# Patient Record
Sex: Female | Born: 1955 | Race: White | Hispanic: No | Marital: Married | Smoking: Never smoker
Health system: Southern US, Community
[De-identification: ages and names within clinical notes are randomized; demographics above are authoritative.]

## PROBLEM LIST (undated history)

## (undated) DIAGNOSIS — I700 Atherosclerosis of aorta: Secondary | ICD-10-CM

## (undated) HISTORY — DX: Atherosclerosis of aorta: I70.0

---

## 1998-09-19 ENCOUNTER — Other Ambulatory Visit: Admission: RE | Admit: 1998-09-19 | Discharge: 1998-09-19 | Payer: Self-pay | Admitting: Obstetrics and Gynecology

## 1999-06-10 ENCOUNTER — Ambulatory Visit (HOSPITAL_COMMUNITY): Admission: RE | Admit: 1999-06-10 | Discharge: 1999-06-10 | Payer: Self-pay | Admitting: *Deleted

## 1999-10-07 ENCOUNTER — Other Ambulatory Visit: Admission: RE | Admit: 1999-10-07 | Discharge: 1999-10-07 | Payer: Self-pay | Admitting: Obstetrics and Gynecology

## 1999-10-26 ENCOUNTER — Other Ambulatory Visit: Admission: RE | Admit: 1999-10-26 | Discharge: 1999-10-26 | Payer: Self-pay | Admitting: Obstetrics and Gynecology

## 1999-12-17 ENCOUNTER — Other Ambulatory Visit: Admission: RE | Admit: 1999-12-17 | Discharge: 1999-12-17 | Payer: Self-pay | Admitting: Obstetrics and Gynecology

## 1999-12-17 ENCOUNTER — Encounter (INDEPENDENT_AMBULATORY_CARE_PROVIDER_SITE_OTHER): Payer: Self-pay

## 2000-02-22 ENCOUNTER — Encounter (INDEPENDENT_AMBULATORY_CARE_PROVIDER_SITE_OTHER): Payer: Self-pay | Admitting: Specialist

## 2000-02-23 ENCOUNTER — Inpatient Hospital Stay (HOSPITAL_COMMUNITY): Admission: AD | Admit: 2000-02-23 | Discharge: 2000-02-24 | Payer: Self-pay | Admitting: Obstetrics and Gynecology

## 2001-04-17 ENCOUNTER — Other Ambulatory Visit: Admission: RE | Admit: 2001-04-17 | Discharge: 2001-04-17 | Payer: Self-pay | Admitting: Obstetrics and Gynecology

## 2002-02-07 ENCOUNTER — Ambulatory Visit (HOSPITAL_COMMUNITY): Admission: RE | Admit: 2002-02-07 | Discharge: 2002-02-07 | Payer: Self-pay | Admitting: Gastroenterology

## 2002-02-13 ENCOUNTER — Ambulatory Visit (HOSPITAL_COMMUNITY): Admission: RE | Admit: 2002-02-13 | Discharge: 2002-02-13 | Payer: Self-pay | Admitting: Gastroenterology

## 2002-02-13 ENCOUNTER — Encounter: Payer: Self-pay | Admitting: Gastroenterology

## 2002-03-01 ENCOUNTER — Encounter: Admission: RE | Admit: 2002-03-01 | Discharge: 2002-03-01 | Payer: Self-pay | Admitting: Psychiatry

## 2002-03-08 ENCOUNTER — Encounter: Admission: RE | Admit: 2002-03-08 | Discharge: 2002-03-08 | Payer: Self-pay | Admitting: Psychiatry

## 2002-03-19 ENCOUNTER — Encounter: Admission: RE | Admit: 2002-03-19 | Discharge: 2002-03-19 | Payer: Self-pay | Admitting: Psychiatry

## 2002-05-11 ENCOUNTER — Other Ambulatory Visit: Admission: RE | Admit: 2002-05-11 | Discharge: 2002-05-11 | Payer: Self-pay | Admitting: Obstetrics and Gynecology

## 2003-05-21 ENCOUNTER — Other Ambulatory Visit: Admission: RE | Admit: 2003-05-21 | Discharge: 2003-05-21 | Payer: Self-pay | Admitting: Obstetrics and Gynecology

## 2003-05-27 ENCOUNTER — Ambulatory Visit (HOSPITAL_COMMUNITY): Admission: RE | Admit: 2003-05-27 | Discharge: 2003-05-27 | Payer: Self-pay | Admitting: Gastroenterology

## 2003-07-16 ENCOUNTER — Other Ambulatory Visit: Admission: RE | Admit: 2003-07-16 | Discharge: 2003-07-16 | Payer: Self-pay | Admitting: Obstetrics and Gynecology

## 2004-10-05 ENCOUNTER — Encounter: Admission: RE | Admit: 2004-10-05 | Discharge: 2004-10-05 | Payer: Self-pay | Admitting: Obstetrics and Gynecology

## 2004-11-23 ENCOUNTER — Encounter: Admission: RE | Admit: 2004-11-23 | Discharge: 2004-11-23 | Payer: Self-pay | Admitting: Gastroenterology

## 2005-08-13 ENCOUNTER — Other Ambulatory Visit: Admission: RE | Admit: 2005-08-13 | Discharge: 2005-08-13 | Payer: Self-pay | Admitting: Obstetrics and Gynecology

## 2007-01-24 ENCOUNTER — Ambulatory Visit (HOSPITAL_COMMUNITY): Admission: RE | Admit: 2007-01-24 | Discharge: 2007-01-24 | Payer: Self-pay | Admitting: Obstetrics and Gynecology

## 2007-01-24 ENCOUNTER — Ambulatory Visit: Payer: Self-pay | Admitting: *Deleted

## 2009-11-14 ENCOUNTER — Encounter: Admission: RE | Admit: 2009-11-14 | Discharge: 2009-11-14 | Payer: Self-pay | Admitting: Obstetrics and Gynecology

## 2010-08-01 ENCOUNTER — Encounter: Payer: Self-pay | Admitting: Dermatology

## 2010-11-27 NOTE — H&P (Signed)
[facility]  Patient:    Mary Maxwell, Mary Maxwell                        MRN: [id number] Adm. Date:  02/22/00 Attending:  Duke Salvia. Marcelle Overlie, M.D.                         History and Physical  CHIEF COMPLAINT:              Symptomatic leiomyoma.  HISTORY OF PRESENT ILLNESS:   This 55 year old G 1, P[address]O. [address], with one prior vaginal delivery 24 years ago, has been on OCPs for contraction.  In the past six to nine months she has experienced pelvic pressure, pelvic pain, dysmenorrhea, despite being on her OCPs.  This has become progressively more uncomfortable for her.  We did an office ultrasound dated December 16, 1999, showing a number of fibroids, 4.3 cm x 3.7 cm, and 3.4 cm x 2.9 cm, and 1.5 cm 1.7 cm, and one fibroid with an area of possible degeneration.  The adnexa were unremarkable.  Because of her increasing symptomatology despite being on OCPs, she presents now for a definitive hysterectomy and BSO.  This procedure, including the risks of bleeding, infection, transfusion, adjacent organ injury, the possible need to complete the surgery abdominally, ERT, and other risks regarding phlebitis, wound infection, etc. are reviewed with her.  She remains firm in her decision to proceed.  ALLERGIES:                    None.  PAST SURGICAL HISTORY:        1. In 1982, had a deviated septum repaired.                               2. One vaginal delivery at term.  REVIEW OF SYSTEMS:            Otherwise unremarkable.  CURRENT MEDICATIONS:          OCPs.  PHYSICAL EXAMINATION:  VITAL SIGNS:                  Temperature 98.2 degrees, blood pressure 130/80.  HEENT:                        Unremarkable.  NECK:                         Supple without masses.  LUNGS:                        Clear.  CARDIOVASCULAR:               A regular rate and rhythm without murmurs, rubs, or gallops.  BREASTS:                      Without masses.  ABDOMEN:                       Soft, flat, nontender.  PELVIC:                       Normal external genitalia.  Vagina and cervix clear.  Uterus upper limits of normal size, slightly irregular.  Adnexa negative.  EXTREMITIES:  Unremarkable.  NEUROLOGIC:                   Unremarkable.  IMPRESSION:                   Symptomatic leiomyoma.  PLAN:                         LAVH, BSO.  The procedure and risks were discussed as above. DD:  02/16/00 TD:  02/16/00 Job: [id number]

## 2010-11-27 NOTE — Op Note (Signed)
   Mary Maxwell, GUILLERMO                       ACCOUNT NO.:  [id number]   MEDICAL RECORD NO.:  [id number]                   PATIENT TYPE:  AMB   LOCATION:  ENDO                                 FACILITY:  [facility]   PHYSICIAN:  Anselmo Rod, M.D.               DATE OF BIRTH:  11-28-1955   DATE OF PROCEDURE:  05/27/2003  DATE OF DISCHARGE:                                 OPERATIVE REPORT   PROCEDURE PERFORMED:  Screening colonoscopy.   ENDOSCOPIST:  Anselmo Rod, M.D.   INSTRUMENT USED:  Olympus video colonoscope.   INDICATION FOR PROCEDURE:  Rectal bleeding in a 55 year old white female  with a family history of colon cancer and chronic constipation, rule out  colonic polyps, masses, etc; abnormal weight loss as well, in spite of her  regular diet.   PREPROCEDURE PREPARATION:  Informed consent was procured from the patient.  The patient was fasted for eight hours prior to the procedure and prepped  with a bottle of magnesium citrate and a gallon of GoLYTELY the night prior  to the procedure.   PREPROCEDURE PHYSICAL:  VITAL SIGNS:  The patient had stable vital signs.  NECK:  Neck supple.  CHEST:  Chest clear to auscultation.  S1 and S2 regular.  ABDOMEN:  Abdomen soft with normal bowel sounds.   DESCRIPTION OF THE PROCEDURE:  The patient was placed in the left lateral  decubitus position and sedated with 50 mg of Demerol and 5 mg of Versed  intravenously.  Once the patient was adequately sedate and maintained on low-  flow oxygen and continuous cardiac monitoring, the Olympus video colonoscope  was advanced from the rectum to the cecum.  The appendiceal orifice and  ileocecal valve were visualized and photographed.  No masses, polyps,  erosions, ulcerations or diverticula were seen.  Retroflexion in the rectum  revealed no abnormalities.   IMPRESSION:  Normal colonoscopy.   RECOMMENDATIONS:  1. Repeat CRC screening is recommended in the next five years, unless the  patient develops any abnormal     symptoms in the interim.  2. Continue a high-fiber diet with liberal fluid intake.  3. Outpatient followup in the next four to six weeks, earlier if need be.                                               Anselmo Rod, M.D.    JNM/MEDQ  D:  05/27/2003  T:  05/27/2003  Job:  [id number]   cc:   Soyla Murphy. Renne Crigler, M.D.  [address]  [place]  [place] [address]  Fax: [phone]

## 2010-11-27 NOTE — Op Note (Signed)
   NAMESURI, TAFOLLA                       ACCOUNT NO.:  [id number]   MEDICAL RECORD NO.:  [id number]                   PATIENT TYPE:  AMB   LOCATION:  ENDO                                 FACILITY:  [facility]   PHYSICIAN:  Charna Elizabeth, M.D.                   DATE OF BIRTH:  [birth year]   DATE OF PROCEDURE:  02/07/2002  DATE OF DISCHARGE:                                 OPERATIVE REPORT   PROCEDURE PERFORMED:  Flexible sigmoidoscopy up to 50 cm.   ENDOSCOPIST:  Charna Elizabeth, M.D.   INSTRUMENT USED:  Olympus video colonoscope.   INDICATIONS FOR PROCEDURE:  The patient is a 55 year old white female with a  history of rectal pressure and difficult defecation.  Rule out rectal  masses, hemorrhoids, etc.   PREPROCEDURE PREPARATION:  Informed consent was procured from the patient.  The patient was fasted for eight hours prior to the procedure and prepped  with two Fleet's enemas the morning of the procedure.   PREPROCEDURE PHYSICAL:  The patient had stable vital signs.  Neck supple.  Chest clear to auscultation.  S1, S2 regular.  Abdomen soft with normal  bowel sounds.   DESCRIPTION OF PROCEDURE:  The patient was placed in the left lateral  decubitus position and sedated with 50 mg of Demerol and 5 mg of Versed  intravenously.  Once the patient was adequately sedated and maintained on  low flow oxygen and continuous cardiac monitoring, the Olympus video  colonoscope was advanced from the rectum to 50 cm without difficulty.  No  masses, polyps, erosions, ulceration or diverticula were seen.  The patient  tolerated the procedure well without complication.   IMPRESSION:  Essentially normal flexible sigmoidoscopy up to 50 cm.   RECOMMENDATIONS:  1. Pelvic ultrasound versus CT if symptoms persist.  2. High fiber diet.  3. Outpatient follow-up in the next two weeks.                                                 Charna Elizabeth, M.D.    JM/MEDQ  D:  02/07/2002  T:  02/12/2002   Job:  [id number]

## 2010-11-27 NOTE — Op Note (Signed)
[facility]  Patient:    Mary Maxwell, Mary Maxwell                    MRN: [id number] Proc. Date: 02/22/00 Adm. Date:  [id number] Attending:  Rhina Brackett                           Operative Report  PREOPERATIVE DIAGNOSIS:       Symptomatic leiomyoma.  POSTOPERATIVE DIAGNOSIS:      Symptomatic leiomyoma.  OPERATION:                    Laparoscopic-assisted vaginal hysterectomy, bilateral salpingo-oophorectomy.  SURGEON:                      Duke Salvia. Marcelle Overlie, M.D.  ASSISTANT:                    Guy Sandifer. Arleta Creek, M.D.  ANESTHESIA:                   General endotracheal.  COMPLICATIONS:                None.  DRAINS:                       Foley catheter.  ESTIMATED BLOOD LOSS:         800.  DESCRIPTION OF PROCEDURE:     The patient was taken to the operating room. After an adequate level of general anesthesia was obtained with the patients legs in stirrups, the abdomen, perineum, and vagina were prepped and draped in the usual manner for laparoscopy.  The bladder was drained and EUA carried out.  The uterus was felt to be 9-10 weeks size, irregular.  A Hulka tenaculum was positioned at that point.  Attention was directed to the abdomen where a 2 cm subumbilical incision was made.  The Veress needle was introduced without difficulty.  Intra-abdominal position was verified by pressure and water testing.  Previously, the bladder had been drained preoperatively with an in-and-out catheter.  Laparoscopic trocar and sleeve were then introduced without difficulty.  There was no evidence of any bleeding or trauma.  Three fingerbreadths above the symphysis in the midline a second puncture was made with a 5 mm trocar and blunt probe was inserted.  The uterus itself was enlarged 9-10 weeks size with irregular fibroids noted.  The cul-de-sac was free and clear. Both tubes and ovaries appeared to be normal.  A third 5 mm trocar was inserted half way  between the symphysis and the umbilicus in the midline under direct visualization.  With the uterus anteflexed, the right tube and ovary were grasped with the atraumatic grasper and put on traction medial.  The infundibulopelvic ligament was isolated.  The course of the ureter was traced and noted to be well below. The bipolar (tripolar) was then used coagulate the IP ligament and cut.  This was carried up to the area of the round ligament which was partially coagulated and cut on that side freeing up the right ovary.  On the left side, the left tube and ovary were normal.  Again, they were placed medially on traction.  The left IP ligament was isolated.  The coarse of the ureter was inspected and noted to be well below.  This area was coagulated and cut in a similar fashion up to  the round ligament freeing up the left tube and ovary . At this point, we started the vaginal portion of the procedure.  The legs were hyperextended.  A weighted speculum was positioned.  The cervix was grasped with a tenaculum. The cervical vaginal mucosa was circumscribed with a scalpel.  Posterior culdotomy performed without difficulty.  The left uterosacral ligament was clamped, divided and suture ligated in a Heaney fashion with 0 Dexon and help temporarily.  The same was repeated on the opposite site. The bladder was then advanced superiorly with sharp and blunt dissection.  Staying close to the uterus, the cardinal ligament, uterine vasculature pedicles were clamped, divided and suture ligated with 0 Dexon. Morcellization was then required.  Two large fibroids were removed allowing reduction and delivery of the posterior fundus of the uterus.  The bladder was insufflated with saline at that point to better identify the anterior peritoneal reflection between uterus and bladder was then identified; the remaining utero-ovarian pedicles were clamped and suture ligated after free tying on both sides, and the  specimen was excised.  The cuff was closed with a running locked 2-0 Dexon suture.  The cuff was closed from anterior to posterior with figure-of-eight 2-0 Dexon sutures.  The Foley catheter was repositioned draining clear urine.  Attention was directed to the abdomen where the abdomen where the abdomen was reinsufflated. Some small oozing areas along the left vaginal cuff were coagulated with the bipolar.  These areas were then copiously irrigated and inspected over five minutes and noted to be hemostatic. These areas were then copiously irrigated and inspected over five minutes and noted to be hemostatic.  After further irrigation and one last inspection these areas were noted to be hemostatic.  The instruments were removed. Gas was allowed to escape.  The defects were closed with 4-0 Dexon subcuticular sutures and Steri-Strips.  She tolerated this well and went to the Recovery Room in good condition. DD:  02/22/00 TD:  02/22/00 Job: [id number]

## 2010-11-27 NOTE — Procedures (Signed)
[place]  Patient:    Mary Maxwell                       MRN: [id number] Proc. Date: 06/10/99 Adm. Date:  [id number] Attending:  Sabino Gasser                           Procedure Report  PROCEDURE:  Colonoscopy.  INDICATIONS:  Rectal bleeding.  ANESTHESIA:  Demerol 80 mg, Versed 8 mg was given intravenously in divided dose.  PROCEDURE:  With patient mildly sedated and in the left lateral decubitus position, the Olympus videoscopic colonoscope was inserted in the rectum and passed under  direct vision to the cecum.  Cecum identified by the ileocecal valve and appendiceal orifice, both of which were photographed.  From this point, the colonoscope is slowly withdrawn, taking circumferential views of the entire colonic mucosa, stopping only in the rectum, which appeared normal on direct and retroflexed view.  The endoscope straightened and withdrawn.  Patients vital signs and pulse oximeter remained stable.  The patient tolerated the procedure well without apparent complications.  FINDINGS:  Essentially normal, but very tortuous colon.  External hemorrhoids were the only finding, which may very well account for this patients bleeding.  PLAN:  Follow up with me on an as-needed-basis and for cancer screening in about five years. DD:  06/10/99 TD:  06/10/99 Job: [id number]

## 2010-11-27 NOTE — Discharge Summary (Signed)
[facility]  Patient:    Mary Maxwell, Mary Maxwell                    MRN: [id number] Adm. Date:  [id number] Disc. Date: [id number] Attending:  Rhina Brackett                           Discharge Summary  DISCHARGE DIAGNOSES:          1. Symptomatic leiomyoma.                               2. Laparoscopically-assisted vaginal                                  hysterectomy and bilateral                                  salpingo-oophorectomy this admission.  HISTORY OF PRESENT ILLNESS:   Please see the admission H&P for details. Briefly, this 55 year old with symptomatic leiomyoma presents for definitive hysterectomy.  HOSPITAL COURSE:              On February 22, 2000, under general anesthesia, the patient underwent LAVH and BSO.  The EBL was 800 cc.  On the first postoperative day, the catheter was removed.  She was allowed to ambulate. Her hemoglobin had been checked on postoperative day #0 and was noted to be 11.7.  On postoperative day #1, it was 10.0.  By the second postoperative day, she again remained afebrile, had established normal bowel and urinary function, was ambulating well, and was ready for discharge.  LABORATORY DATA:              The admission UA was negative.  Blood type was A-.  The antibiotic screen was negative.  The preoperative hemoglobin was 14.1 on February 18, 2000.  On February 22, 2000, it was 11.7 at noon and at 6 p.m. was 10.8.  Then on postoperative day #1, February 23, 2000, it was 10.0.  The pathology report is still pending.  DISCHARGE MEDICATIONS:        1. Ferrous sulfate once daily.                               2. Tylox p.r.n. pain.                               3. Climara 0.1 patch, which was already applied                                  before discharge.  FOLLOW-UP:                    She will return to the office in one week.  SPECIAL INSTRUCTIONS:         Advised to report any incisional redness or drainage from  the laparoscopic incisions, increased vaginal bleeding, and fever over 101 degrees.  She was given specific instructions regarding diet, sex, and exercise.  CONDITION ON DISCHARGE:  Good.  ACTIVITY:                     Graded increase. DD:  02/24/00 TD:  02/24/00 Job: [id number]

## 2013-10-16 ENCOUNTER — Ambulatory Visit (HOSPITAL_BASED_OUTPATIENT_CLINIC_OR_DEPARTMENT_OTHER): Admit: 2013-10-16 | Payer: Self-pay | Admitting: Otolaryngology

## 2013-10-16 ENCOUNTER — Encounter (HOSPITAL_BASED_OUTPATIENT_CLINIC_OR_DEPARTMENT_OTHER): Payer: Self-pay

## 2013-10-16 SURGERY — TONSILLECTOMY AND ADENOIDECTOMY
Anesthesia: General | Laterality: Bilateral

## 2015-02-25 ENCOUNTER — Telehealth: Payer: Self-pay | Admitting: Cardiovascular Disease

## 2015-02-25 NOTE — Telephone Encounter (Signed)
Received records from [facility] for appointment on 03/20/15 with Dr Woodbine.  Records given to N Hines (medical records) for Dr Crescent's schedule on 03/20/15. lp °

## 2015-03-20 ENCOUNTER — Encounter: Payer: Self-pay | Admitting: Cardiovascular Disease

## 2015-03-20 ENCOUNTER — Ambulatory Visit (INDEPENDENT_AMBULATORY_CARE_PROVIDER_SITE_OTHER): Payer: 59 | Admitting: Cardiovascular Disease

## 2015-03-20 VITALS — BP 134/78 | HR 58 | Ht 67.0 in | Wt 135.0 lb

## 2015-03-20 DIAGNOSIS — E785 Hyperlipidemia, unspecified: Secondary | ICD-10-CM | POA: Diagnosis not present

## 2015-03-20 DIAGNOSIS — R002 Palpitations: Secondary | ICD-10-CM | POA: Diagnosis not present

## 2015-03-20 DIAGNOSIS — Z8249 Family history of ischemic heart disease and other diseases of the circulatory system: Secondary | ICD-10-CM

## 2015-03-20 NOTE — Patient Instructions (Signed)
Your physician recommends that you return for lab work at your earliest convenience - FASTING.  Your physician has requested that you have an abdominal aorta duplex. During this test, an ultrasound is used to evaluate the aorta. Allow 30 minutes for this exam. Do not eat after midnight the day before and avoid carbonated beverages  Your physician has recommended that you wear a holter monitor. Holter monitors are medical devices that record the heart's electrical activity. Doctors most often use these monitors to diagnose arrhythmias. Arrhythmias are problems with the speed or rhythm of the heartbeat. The monitor is a small, portable device. You can wear one while you do your normal daily activities. This is usually used to diagnose what is causing palpitations/syncope (passing out).  Dr Duke Salvia recommends that you schedule a follow-up appointment in 1 year. You will receive a reminder letter in the mail two months in advance. If you don't receive a letter, please call our office to schedule the follow-up appointment.

## 2015-03-20 NOTE — Progress Notes (Signed)
Cardiology Office Note   Date:  03/20/2015   ID:  [name], DOB 12-21-1955, MRN [id number]  PCP:  Meriel Pica, MD  Cardiologist:   Madilyn Hook, MD   Chief Complaint  Patient presents with  . Advice Only  . Palpitations    feel them in the morning when she wakes up, feels more like a tremor or flutter      History of Present Illness: Mary Maxwell is a 59 y.o. female who presents for an evaluation of palpitations.  Ms. Fines reports palpitations that began in April. At the time she was bitten by a tick and had symptoms of Lyme disease. She has been taking doxycycline since that time and thinks that the symptoms are getting somewhat better. She had an erythematous rash on her abdomen as well as on her wrists and ankles. Each morning she feels an internal tremor when awakening. It is not associated with chest pain, shortness of breath, lightheadedness or dizziness. The symptoms lasted until she stands up. She does not know some during the day. She denies any lower extremity edema, orthopnea or PND.  Ms. Colbaugh exercises regularly. She walks up 3 flights of stairs daily at work. She also walks daily and likes to work in her garden. Her diet is excellent. She does not eat much meat, but eats lots of fruits and vegetables. She also eats mostly whole gr and drinks mostly water. She does drink 2-3 cups of coffee daily. She drinks 2 cups every morning and occasionally has one at night. She drinks one glass of wine daily.    History reviewed. No pertinent past medical history.  History reviewed. No pertinent past surgical history.   Current Outpatient Prescriptions  Medication Sig Dispense Refill  . CLIMARA 0.05 MG/24HR patch Take 0.05 mg by mouth daily.    Marland Kitchen doxycycline (VIBRAMYCIN) 100 MG capsule Take 100 mg by mouth daily.  5   No current facility-administered medications for this visit.    Allergies:   Review of patient's allergies indicates no known  allergies.    Social History:  The patient  reports that she has never smoked. She does not have any smokeless tobacco history on file. She reports that she drinks about 0.6 oz of alcohol per week. She reports that she does not use illicit drugs.   Family History:  The patient's family history includes AAA (abdominal aortic aneurysm) in her mother; Dementia in her mother; Heart attack in her father; Heart disease in her father.    ROS:  Please see the history of present illness.   Otherwise, review of systems are positive for none.   All other systems are reviewed and negative.    PHYSICAL EXAM: VS:  BP 134/78 mmHg  Pulse 58  Ht  (1.702 m)  Wt 61.236 kg (135 lb)  BMI 21.14 kg/m2 , BMI Body mass index is 21.14 kg/(m^2). GENERAL:  Well appearing HEENT:  Pupils equal round and reactive, fundi not visualized, oral mucosa unremarkable NECK:  No jugular venous distention, waveform within normal limits, carotid upstroke brisk and symmetric, no bruits, no thyromegaly LYMPHATICS:  No cervical adenopathy LUNGS:  Clear to auscultation bilaterally HEART:  RRR.  PMI not displaced or sustained,S1 and S2 within normal limits, no S3, no S4, no clicks, no rubs, no murmurs ABD:  Flat, positive bowel sounds normal in frequency in pitch, no bruits, no rebound, no guarding, no midline pulsatile mass, no hepatomegaly, no splenomegaly EXT:  2 plus pulses throughout, no edema, no cyanosis no clubbing SKIN:  No rashes no nodules NEURO:  Cranial nerves II through XII grossly intact, motor grossly intact throughout PSYCH:  Cognitively intact, oriented to person place and time    EKG:  EKG is ordered today. The ekg ordered today demonstrates sinus bradycardia at 58 bpm.  Low voltage limb and precordial leads.   Recent Labs: No results found for requested labs within last 365 days.    Lipid Panel No results found for: CHOL, TRIG, HDL, CHOLHDL, VLDL, LDLCALC, LDLDIRECT    Wt Readings from Last 3  Encounters:  03/20/15 61.236 kg (135 lb)      Other studies Reviewed: Additional studies/ records that were reviewed today include: . Review of the above records demonstrates:  Please see elsewhere in the note.     ASSESSMENT AND PLAN:  # Palpitations: Palpitations are not associated with any alarm symptoms. We will obtain a 48 hour Holter to determine if she is having any significant arrhythmias.  # Family history of AAA: Ms. Yearby's mother and grandmother had large AAA's.  Her mother's is 7.9cm.  she has never had an abdominal ultrasound. We'll obtain one for screening purposes today.  # CV disease prevention: Ms. Sherwood has not had her lipids checked recently. We will obtain a fasting lipid panel. She is not fasting today but will return in another day to get this.  Current medicines are reviewed at length with the patient today.  The patient does not have concerns regarding medicines.  The following changes have been made:  no change  Labs/ tests ordered today include:   Orders Placed This Encounter  Procedures  . Lipid panel  . Holter monitor - 48 hour  . EKG 12-Lead     Disposition:   FU with Dr. Elmarie Shiley C. Duke Salvia in 1 year.    Signed, Madilyn Hook, MD  03/20/2015 10:15 AM     [facility]

## 2015-03-27 ENCOUNTER — Other Ambulatory Visit: Payer: Self-pay | Admitting: Cardiovascular Disease

## 2015-03-27 ENCOUNTER — Ambulatory Visit (HOSPITAL_COMMUNITY)
Admission: RE | Admit: 2015-03-27 | Discharge: 2015-03-27 | Disposition: A | Discharge status: Home / Self Care | Payer: 59 | Source: Ambulatory Visit | Attending: Cardiology | Admitting: Cardiology

## 2015-03-27 DIAGNOSIS — I700 Atherosclerosis of aorta: Secondary | ICD-10-CM

## 2015-03-27 DIAGNOSIS — Z8249 Family history of ischemic heart disease and other diseases of the circulatory system: Secondary | ICD-10-CM | POA: Insufficient documentation

## 2015-03-27 LAB — LIPID PANEL
CHOL/HDL RATIO: 3 ratio (ref <=5.0)
CHOLESTEROL: 211 mg/dL — AB (ref 125–200)
HDL: 70 mg/dL (ref >=46)
LDL Cholesterol: 130 mg/dL — ABNORMAL HIGH (ref <130)
Triglycerides: 56 mg/dL (ref <150)
VLDL: 11 mg/dL (ref <30)

## 2015-03-28 ENCOUNTER — Ambulatory Visit (INDEPENDENT_AMBULATORY_CARE_PROVIDER_SITE_OTHER): Payer: 59

## 2015-03-28 DIAGNOSIS — R002 Palpitations: Secondary | ICD-10-CM | POA: Diagnosis not present

## 2015-04-01 ENCOUNTER — Telehealth: Payer: Self-pay | Admitting: *Deleted

## 2015-04-01 NOTE — Telephone Encounter (Signed)
Left detail message with results , any question may call back

## 2015-04-01 NOTE — Telephone Encounter (Signed)
Pt called and wanted to thank you for giving her the information.

## 2015-04-01 NOTE — Telephone Encounter (Signed)
-----   Message from Chilton Si, MD sent at 03/28/2015  4:54 PM EDT ----- Normal abdominal ultrasound.  No AAA.

## 2015-04-01 NOTE — Telephone Encounter (Signed)
-----   Message from Chilton Si, MD sent at 03/28/2015  4:43 PM EDT ----- Cholesterol levels are high, but not high enough to need medications yet.  HDL (good cholesterol) level is good.  Her diet is already good, but avoid fried/fatty foods, oils that are solid at room temperature and red meat.  Keep up the exercise.  Will need to continue to monitor annually.

## 2015-04-01 NOTE — Telephone Encounter (Signed)
Left detailed message concerning results  Any question may call back

## 2015-04-04 ENCOUNTER — Telehealth: Payer: Self-pay | Admitting: Cardiovascular Disease

## 2015-04-04 NOTE — Telephone Encounter (Signed)
New Message  Pt calling about Holter monitor resutsl. Please call back and discuss.

## 2015-04-04 NOTE — Telephone Encounter (Signed)
Had extensive phone conversation w/ patient, explained results of monitor and indications of PACs/PVCs, explained these, possible mitigating factors such as caffeine intake, diet, stress, etc.  Pt voiced understanding of information given and expressed thanks.

## 2015-04-11 NOTE — Telephone Encounter (Signed)
Spoke to patient. Result given . Verbalized understanding  

## 2015-04-11 NOTE — Telephone Encounter (Signed)
-----   Message from Chilton Si, MD sent at 04/03/2015  1:19 PM EDT ----- Monitor showed occasional early beats from the top chambers of the heart, which is not dangerous but can cause the sensation of palpitations.  At the time she noted heart fluttering her heart rhythm was normal.

## 2015-09-29 ENCOUNTER — Ambulatory Visit (HOSPITAL_COMMUNITY)
Admission: RE | Admit: 2015-09-29 | Discharge: 2015-09-29 | Disposition: A | Discharge status: Home / Self Care | Payer: 59 | Attending: Psychiatry | Admitting: Psychiatry

## 2015-09-29 NOTE — BH Assessment (Signed)
Assessment Note  Mary Maxwell is an 60 y.o. white female that denies SI/HI/Psychosis/Substance Abuse.  Patient reports increased depressed after her mother died in December 2016.  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   Patient reports that she wants outpatient resources.   Patient reports that she feels angry towards her siblings because they feel as if they are pushing to sell her mother's belongings and dispense her assets in her estate to all of the siblings.  Patient reports that the patient is the executor  of her mothers estate.   Patient denies prior inpatient psychiatric hospitalization.  Patient denies receiving any counseling after her mother passed away in December 2016.  Patient signed decline MSE Form    Diagnosis: Major Depressive Disorder   Past Medical History: No past medical history on file.  No past surgical history on file.  Family History:  Family History  Problem Relation Age of Onset  . AAA (abdominal aortic aneurysm) Mother   . Dementia Mother   . Heart attack Father   . Heart disease Father   . Benign prostatic hyperplasia Brother   . Heart attack Maternal Grandfather   . Cancer Paternal Grandmother   . Leukemia Paternal Grandfather     Social History:  reports that she has never smoked. She does not have any smokeless tobacco history on file. She reports that she drinks about 3.0 - 4.2 oz of alcohol per week. She reports that she does not use illicit drugs.  Additional Social History:  Alcohol / Drug Use History of alcohol / drug use?: No history of alcohol / drug abuse  CIWA:   COWS:    Allergies: No Known Allergies  Home Medications:  (Not in a hospital admission)  OB/GYN Status:  No LMP recorded.  General Assessment Data Location of Assessment: [facility] ([facility] ) TTS Assessment: In system Is this a Tele or Face-to-Face Assessment?: Face-to-Face Is this an Initial Assessment or a Re-assessment  for this encounter?: Initial Assessment Marital status: Married ClintonMaiden name: NA Is patient pregnant?: No Pregnancy Status: No Living Arrangements: Spouse/significant other Can pt return to current living arrangement?: Yes Admission Status: Voluntary Is patient capable of signing voluntary admission?: Yes Referral Source: Self/Family/Friend [facility]  Medical Screening Exam [facility] Walk-[facility] ONLY) Medical Exam completed: No Reason for MSE not completed: Patient Refused (Patient signed decline MSE Form)  Crisis Care Plan Living Arrangements: Spouse/significant other Legal Guardian:  (NA) Name of Psychiatrist: None Reported Name of Therapist: None Reported  Education Status Is patient currently in school?: No Current Grade: NA Highest grade of school patient has completed: College Name of school: NA Contact person: NA  Risk to self with the past 6 months Suicidal Ideation: Yes-Currently Present Has patient been a risk to self within the past 6 months prior to admission? : No Suicidal Intent: No Has patient had any suicidal intent within the past 6 months prior to admission? : No Is patient at risk for suicide?: No Suicidal Plan?: No Has patient had any suicidal plan within the past 6 months prior to admission? : No Access to Means: No What has been your use of drugs/alcohol within the last 12 months?: NA Previous Attempts/Gestures: No How many times?: 0 Other Self Harm Risks: NA Triggers for Past Attempts:  (NA) Intentional Self Injurious Behavior: None Family Suicide History: Yes Recent stressful life event(s): Loss (Comment) (Mother died in Dec 2016) Persecutory voices/beliefs?: No Depression: Yes Depression Symptoms: Despondent,  Tearfulness, Fatigue, Guilt, Loss of interest in usual pleasures, Feeling worthless/self pity, Feeling angry/irritable Substance abuse history and/or treatment for substance abuse?: No Suicide prevention information given to non-admitted  patients: Yes  Risk to Others within the past 6 months Homicidal Ideation: No Does patient have any lifetime risk of violence toward others beyond the six months prior to admission? : No Thoughts of Harm to Others: No Current Homicidal Intent: No Current Homicidal Plan: No Access to Homicidal Means: No Identified Victim: None History of harm to others?: No Assessment of Violence: None Noted Violent Behavior Description: None Reported Does patient have access to weapons?: No Criminal Charges Pending?: No Does patient have a court date: No Is patient on probation?: No  Psychosis Hallucinations: None noted Delusions: None noted  Mental Status Report Appearance/Hygiene: Unremarkable Eye Contact: Good Motor Activity: Freedom of movement Speech: Logical/coherent Level of Consciousness: Alert Mood: Depressed Affect: Appropriate to circumstance Anxiety Level: None Thought Processes: Coherent, Relevant Judgement: Unimpaired Orientation: Person, Time, Place, Situation Obsessive Compulsive Thoughts/Behaviors: None  Cognitive Functioning Concentration: Decreased Memory: Remote Intact, Recent Intact IQ: Average Insight: Fair Impulse Control: Fair Appetite: Fair Weight Loss: 0 Weight Gain: 0 Sleep: Decreased Total Hours of Sleep: 4 Vegetative Symptoms: None  ADLScreening [facility] Assessment Services) Patient's cognitive ability adequate to safely complete daily activities?: Yes Patient able to express need for assistance with ADLs?: Yes Independently performs ADLs?: Yes (appropriate for developmental age)  Prior Inpatient Therapy Prior Inpatient Therapy: No Prior Therapy Dates: NA Prior Therapy Facilty/Provider(s): NA Reason for Treatment: NA  Prior Outpatient Therapy Prior Outpatient Therapy: No Prior Therapy Dates: NA Prior Therapy Facilty/Provider(s): NA Reason for Treatment: NA Does patient have an ACCT team?: No Does patient have Intensive In-House Services?  :  No Does patient have [facility] services? : No Does patient have P4CC services?: No  ADL Screening (condition at time of admission) Patient's cognitive ability adequate to safely complete daily activities?: Yes Is the patient deaf or have difficulty hearing?: No Does the patient have difficulty seeing, even when wearing glasses/contacts?: No Does the patient have difficulty concentrating, remembering, or making decisions?: No Patient able to express need for assistance with ADLs?: Yes Does the patient have difficulty dressing or bathing?: No Independently performs ADLs?: Yes (appropriate for developmental age) Does the patient have difficulty walking or climbing stairs?: No Weakness of Legs: None Weakness of Arms/Hands: None  Home Assistive Devices/Equipment Home Assistive Devices/Equipment: None    Abuse/Neglect Assessment (Assessment to be complete while patient is alone) Physical Abuse: Denies Verbal Abuse: Denies Sexual Abuse: Denies Exploitation of patient/patient's resources: Denies Self-Neglect: Denies Values / Beliefs Cultural Requests During Hospitalization: None Spiritual Requests During Hospitalization: None Consults Spiritual Care Consult Needed: No Social Work Consult Needed: No Merchant navy officer (For Healthcare) Does patient have an advance directive?: No Would patient like information on creating an advanced directive?: No - patient declined information    Additional Information 1:1 In Past 12 Months?: No CIRT Risk: No Elopement Risk: No Does patient have medical clearance?: No     Disposition: Per Vernona Rieger, NP - patient does not meet criteria for inpatient hospitalization.  Writer provided the patient with outpatient resources.  Disposition Initial Assessment Completed for this Encounter: Yes Disposition of Patient: Outpatient treatment Type of outpatient treatment: Adult (Per Vernona Rieger, NP - patient does not meet inpt criteria. )  On Site Evaluation by:    Reviewed with Physician:    Phillip Heal LaVerne 09/29/2015 6:48 PM

## 2017-09-16 ENCOUNTER — Telehealth: Payer: Self-pay | Admitting: Cardiovascular Disease

## 2017-09-16 NOTE — Telephone Encounter (Signed)
New Message:    Pt says she needs al letter of Good Health for her insurance company.Please call,she will give you the details.

## 2017-09-16 NOTE — Telephone Encounter (Signed)
F/U call: ° °Patient returning call  °

## 2017-09-16 NOTE — Telephone Encounter (Signed)
Spoke with patient and she has retired. She is currently uninsured and is trying to get health insurance. She is needing a letter of good health to be done by Dr Duke Salviaandolph. Patient states she has had no problems since being seen and palpations subsided after visit. Will discuss with Dr Duke Salviaandolph when she is back in the office Monday

## 2017-09-16 NOTE — Telephone Encounter (Signed)
Left message to call back  

## 2017-09-20 NOTE — Telephone Encounter (Signed)
Follow up    Patient calling to be sure letter is ready for pick up during office visit on 3/13.

## 2017-09-20 NOTE — Telephone Encounter (Signed)
Spoke with patient and she will be at appointment tomorrow and discuss what she needs with Dr Duke Salviaandolph in the morning

## 2017-09-21 ENCOUNTER — Encounter: Payer: Self-pay | Admitting: Cardiovascular Disease

## 2017-09-21 ENCOUNTER — Ambulatory Visit (INDEPENDENT_AMBULATORY_CARE_PROVIDER_SITE_OTHER): Payer: Self-pay | Admitting: Cardiovascular Disease

## 2017-09-21 VITALS — BP 128/74 | HR 70 | Ht 65.5 in | Wt 147.8 lb

## 2017-09-21 DIAGNOSIS — I700 Atherosclerosis of aorta: Secondary | ICD-10-CM

## 2017-09-21 DIAGNOSIS — E785 Hyperlipidemia, unspecified: Secondary | ICD-10-CM

## 2017-09-21 HISTORY — DX: Atherosclerosis of aorta: I70.0

## 2017-09-21 NOTE — Progress Notes (Signed)
Cardiology Office Note   Date:  09/21/2017   ID:  Mary RocherDeborah B Maxwell, DOB September 25, 1955, MRN [id number]  PCP:  Mary Maxwell, Richard, MD  Cardiologist:   [facility], MD   Chief Complaint  Patient presents with  . Follow-up      History of Present Illness: Mary Maxwell is a [age] y.o. female with palpitations who presents for follow up.  Ms. Mary Maxwell reported palpitations that began 10/2014. At the time she was bitten by a tick and had symptoms of Lyme disease. She has been taking doxycycline since that time and thinks that the symptoms are getting somewhat better. She had an erythematous rash on her abdomen as well as on her wrists and ankles.  She wore a 48 hour Holter that showed on PVC and several PACs.  She noted palpitations several times at which time sinus rhythm was recorded. She had an abdominal aorta ultrasound that showed no AAA but did reveal aortoiliac atherosclerosis without stenosis.    Since her last appointment Ms. Mary Maxwell has been doing well.  She has no chest pain or shortness of breath. She exercises regularly. She walked up 3 flights of stairs daily at work without exertional symptoms.  Ms. Mary Maxwell retired from work 08/2017.  After retiring she has been trying to get insurance.  This was denied due to a finding of atherosclerosis in the aorta.  She has no claudication, lower extremity edema, orthopnea or PND.  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  She is working to improve her diet.  Her BP was recently 125 systolic with her PCP.  She thinks it was elevated due to the stress of finding new insurance.    Past Medical History:  Diagnosis Date  . Atherosclerosis of aorta (HCC) 09/21/2017    History reviewed. No pertinent surgical history.   Current Outpatient Medications  Medication Sig Dispense Refill  . Cholecalciferol (VITAMIN D PO) Take by mouth daily.    Marland Kitchen. CLIMARA 0.05 MG/24HR patch Take 0.05 mg by mouth daily.    Marland Kitchen.  doxycycline (VIBRAMYCIN) 100 MG capsule Take 100 mg by mouth daily.  5   No current facility-administered medications for this visit.     Allergies:   Patient has no known allergies.    Social History:  The patient  reports that  has never smoked. she has never used smokeless tobacco. She reports that she drinks about 3.0 - 4.2 oz of alcohol per week. She reports that she does not use drugs.   Family History:  The patient's family history includes AAA (abdominal aortic aneurysm) in her mother; Benign prostatic hyperplasia in her brother; Cancer in her paternal grandmother; Dementia in her mother; Heart attack in her father and maternal grandfather; Heart disease in her father; Leukemia in her paternal grandfather.    ROS:  Please see the history of present illness.   Otherwise, review of systems are positive for none.   All other systems are reviewed and negative.    PHYSICAL EXAM: VS:  BP 128/74   Pulse 70   Ht 5' 5.5" (1.664 m)   Wt 147 lb 12.8 oz (67 kg)   BMI 24.22 kg/m  , BMI Body mass index is 24.22 kg/m. GENERAL:  Well appearing HEENT: Pupils equal round and reactive, fundi not visualized, oral mucosa unremarkable NECK:  No jugular venous distention, waveform within normal limits, carotid upstroke brisk and symmetric, no bruits LUNGS:  Clear to auscultation bilaterally HEART:  RRR.  PMI not displaced or sustained,S1 and S2 within normal limits, no S3, no S4, no clicks, no rubs, no murmurs ABD:  Flat, positive bowel sounds normal in frequency in pitch, no bruits, no rebound, no guarding, no midline pulsatile mass, no hepatomegaly, no splenomegaly EXT:  2 plus pulses throughout, no edema, no cyanosis no clubbing SKIN:  No rashes no nodules NEURO:  Cranial nerves II through XII grossly intact, motor grossly intact throughout PSYCH:  Cognitively intact, oriented to person place and time   EKG:  EKG is ordered today. The ekg ordered 03/20/15 demonstrates sinus bradycardia at 58  bpm.  Low voltage limb and precordial leads. 09/21/17: Sinus rhythm.  Rate 64 bpm.    48 hour Holter 03/28/15: Predominant rhythm: sinus rhythm, sinus arrhythmia Average heart rate: 70 bpm Max heart rate: 96 bpm Min heart rate: 52 bpm Pauses >2.5 seconds: 0 Ventricular ectopics: 1 PVC Supraventricular ectopics: 27 Patient did not submit a symptom diary.  She reported heart fluttering and sinus arrhythmia was noted.     Recent Labs: No results found for requested labs within last 8760 hours.    Lipid Panel    Component Value Date/Time   CHOL 211 (H) 03/26/2015 1302   TRIG 56 03/26/2015 1302   HDL 70 03/26/2015 1302   CHOLHDL 3.0 03/26/2015 1302   VLDL 11 03/26/2015 1302   LDLCALC 130 (H) 03/26/2015 1302      Wt Readings from Last 3 Encounters:  09/21/17 147 lb 12.8 oz (67 kg)  03/20/15 135 lb (61.2 kg)      Other studies Reviewed: Additional studies/ records that were reviewed today include: . Review of the above records demonstrates:  Please see elsewhere in the note.     ASSESSMENT AND PLAN:  # Palpitations: Resolved.  PACs and PVCs on Holter.  # Family history of AAA: No evidence of AAA on ultrasound.  # Atherosclerosis of the aorta: Mild atherosclerosis without stenosis.  She is asymptomatic and otherwise in good health.  She has not received any treatment in the past.   # Elevated lipids: Lipids were elevated in 2016 but not high enough for treatment due to low ASCVD risk.   She will work on diet and exercise and repeat at follow up.  # Elevated BP: BP was elevated initially and better on repeat.  She will track at home.  She thinks it was elevated 2/2 stress.   Current medicines are reviewed at length with the patient today.  The patient does not have concerns regarding medicines.  The following changes have been made:  no change  Labs/ tests ordered today include:   Orders Placed This Encounter  Procedures  . Lipid panel  . Comprehensive metabolic  panel  . EKG 12-Lead      Disposition:   FU with Dr. Elmarie Shiley Mary Maxwell in 3 months.    Signed, Mary Si, MD  09/21/2017 10:39 AM    [facility]

## 2017-09-21 NOTE — Patient Instructions (Signed)
Medication Instructions:  Your physician recommends that you continue on your current medications as directed. Please refer to the Current Medication list given to you today.  Labwork: FASTING LP/CMET IN 3 MONTHS PRIOR TO YOUR FOLLOW UP VISIT   Testing/Procedures: NONE  Follow-Up: Your physician recommends that you schedule a follow-up appointment in: 3 MONTH OV  Any Other Special Instructions Will Be Listed Below (If Applicable). MONITOR YOUR BLOOD PRESSURE AT HOME AND BRING WITH YOU TO YOUR FOLLOW UP APPOINTMENT   If you need a refill on your cardiac medications before your next appointment, please call your pharmacy.

## 2017-09-22 ENCOUNTER — Telehealth: Payer: Self-pay | Admitting: Cardiovascular Disease

## 2017-09-22 NOTE — Telephone Encounter (Signed)
I told her that she could have a copy of her clinic note which states what she needs for the company.  If she wants to pick that up tomorrow that should be OK.

## 2017-09-22 NOTE — Telephone Encounter (Signed)
New message    Patient calling in regards to picking up letter as discussed (see 09/16/17 encounter note).

## 2017-09-22 NOTE — Telephone Encounter (Signed)
Returned call to patient. She was seen by Dr. Duke Salviaandolph yesterday. She did not get a letter at this visit. Explained that MD and Juliette AlcideMelinda are not here right now to ask about the letter but will be back tomorrow. Will send them a note concerning need for letter per request.

## 2017-09-23 NOTE — Telephone Encounter (Signed)
Advised patient at front desk for pick up

## 2017-12-20 LAB — COMPREHENSIVE METABOLIC PANEL
ALBUMIN: 4.1 g/dL (ref 3.6–4.8)
ALT: 12 IU/L (ref 0–32)
AST: 13 IU/L (ref 0–40)
Albumin/Globulin Ratio: 1.5 (ref 1.2–2.2)
Alkaline Phosphatase: 67 IU/L (ref 39–117)
BUN / CREAT RATIO: 20 (ref 12–28)
BUN: 17 mg/dL (ref 8–27)
Bilirubin Total: 0.6 mg/dL (ref 0.0–1.2)
CALCIUM: 9.7 mg/dL (ref 8.7–10.3)
CO2: 26 mmol/L (ref 20–29)
CREATININE: 0.87 mg/dL (ref 0.57–1.00)
Chloride: 101 mmol/L (ref 96–106)
GFR, EST AFRICAN AMERICAN: 83 mL/min/{1.73_m2} (ref >59)
GFR, EST NON AFRICAN AMERICAN: 72 mL/min/{1.73_m2} (ref >59)
GLOBULIN, TOTAL: 2.8 g/dL (ref 1.5–4.5)
GLUCOSE: 87 mg/dL (ref 65–99)
Potassium: 4.2 mmol/L (ref 3.5–5.2)
SODIUM: 141 mmol/L (ref 134–144)
TOTAL PROTEIN: 6.9 g/dL (ref 6.0–8.5)

## 2017-12-20 LAB — LIPID PANEL
CHOLESTEROL TOTAL: 224 mg/dL — AB (ref 100–199)
Chol/HDL Ratio: 3.2 ratio (ref 0.0–4.4)
HDL: 70 mg/dL (ref >39)
LDL Calculated: 138 mg/dL — ABNORMAL HIGH (ref 0–99)
Triglycerides: 79 mg/dL (ref 0–149)
VLDL Cholesterol Cal: 16 mg/dL (ref 5–40)

## 2017-12-27 ENCOUNTER — Ambulatory Visit (INDEPENDENT_AMBULATORY_CARE_PROVIDER_SITE_OTHER): Payer: 59 | Admitting: Cardiovascular Disease

## 2017-12-27 ENCOUNTER — Encounter: Payer: Self-pay | Admitting: Cardiovascular Disease

## 2017-12-27 VITALS — BP 121/79 | HR 65 | Ht 65.5 in | Wt 146.6 lb

## 2017-12-27 DIAGNOSIS — E785 Hyperlipidemia, unspecified: Secondary | ICD-10-CM | POA: Diagnosis not present

## 2017-12-27 DIAGNOSIS — I700 Atherosclerosis of aorta: Secondary | ICD-10-CM

## 2017-12-27 NOTE — Patient Instructions (Signed)
Medication Instructions:  Your physician recommends that you continue on your current medications as directed. Please refer to the Current Medication list given to you today.  Labwork: FASTING LIPID PANEL IN 6 MONTHS   Testing/Procedures: NONE  Follow-Up: Your physician wants you to follow-up in: 6 MONTHS AFTER LIPID PANEL  You will receive a reminder letter in the mail two months in advance. If you don't receive a letter, please call our office to schedule the follow-up appointment.  Any Other Special Instructions Will Be Listed Below (If Applicable).  WORK ON DIET AND EXERCISE    Mediterranean Diet A Mediterranean diet refers to food and lifestyle choices that are based on the traditions of countries located on the [place]. This way of eating has been shown to help prevent certain conditions and improve outcomes for people who have chronic diseases, like kidney disease and heart disease. What are tips for following this plan? Lifestyle  Cook and eat meals together with your family, when possible.  Drink enough fluid to keep your urine clear or pale yellow.  Be physically active every day. This includes: ? Aerobic exercise like running or swimming. ? Leisure activities like gardening, walking, or housework.  Get 7-8 hours of sleep each night.  If recommended by your health care provider, drink red wine in moderation. This means 1 glass a day for nonpregnant women and 2 glasses a day for men. A glass of wine equals 5 oz (150 mL). Reading food labels  Check the serving size of packaged foods. For foods such as rice and pasta, the serving size refers to the amount of cooked product, not dry.  Check the total fat in packaged foods. Avoid foods that have saturated fat or trans fats.  Check the ingredients list for added sugars, such as corn syrup. Shopping  At the grocery store, buy most of your food from the areas near the walls of the store. This includes: ? Fresh  fruits and vegetables (produce). ? Grains, beans, nuts, and seeds. Some of these may be available in unpackaged forms or large amounts (in bulk). ? Fresh seafood. ? Poultry and eggs. ? Low-fat dairy products.  Buy whole ingredients instead of prepackaged foods.  Buy fresh fruits and vegetables in-season from local farmers markets.  Buy frozen fruits and vegetables in resealable bags.  If you do not have access to quality fresh seafood, buy precooked frozen shrimp or canned fish, such as tuna, salmon, or sardines.  Buy small amounts of raw or cooked vegetables, salads, or olives from the deli or salad bar at your store.  Stock your pantry so you always have certain foods on hand, such as olive oil, canned tuna, canned tomatoes, rice, pasta, and beans. Cooking  Cook foods with extra-virgin olive oil instead of using butter or other vegetable oils.  Have meat as a side dish, and have vegetables or grains as your main dish. This means having meat in small portions or adding small amounts of meat to foods like pasta or stew.  Use beans or vegetables instead of meat in common dishes like chili or lasagna.  Experiment with different cooking methods. Try roasting or broiling vegetables instead of steaming or sauteing them.  Add frozen vegetables to soups, stews, pasta, or rice.  Add nuts or seeds for added healthy fat at each meal. You can add these to yogurt, salads, or vegetable dishes.  Marinate fish or vegetables using olive oil, lemon juice, garlic, and fresh herbs. Meal planning  Plan to eat 1 vegetarian meal one day each week. Try to work up to 2 vegetarian meals, if possible.  Eat seafood 2 or more times a week.  Have healthy snacks readily available, such as: ? Vegetable sticks with hummus. ? [place] yogurt. ? Fruit and nut trail mix.  Eat balanced meals throughout the week. This includes: ? Fruit: 2-3 servings a day ? Vegetables: 4-5 servings a day ? Low-fat dairy: 2  servings a day ? Fish, poultry, or lean meat: 1 serving a day ? Beans and legumes: 2 or more servings a week ? Nuts and seeds: 1-2 servings a day ? Whole grains: 6-8 servings a day ? Extra-virgin olive oil: 3-4 servings a day  Limit red meat and sweets to only a few servings a month What are my food choices?  Mediterranean diet ? Recommended ? Grains: Whole-grain pasta. Brown rice. Bulgar wheat. Polenta. Couscous. Whole-wheat bread. Orpah Cobb. ? Vegetables: Artichokes. Beets. Broccoli. Cabbage. Carrots. Eggplant. Green beans. Chard. Kale. Spinach. Onions. Leeks. Peas. Squash. Tomatoes. Peppers. Radishes. ? Fruits: Apples. Apricots. Avocado. Berries. Bananas. Cherries. Dates. Figs. Grapes. Lemons. Melon. Oranges. Peaches. Plums. Pomegranate. ? Meats and other protein foods: Beans. Almonds. Sunflower seeds. Pine nuts. Peanuts. Cod. Salmon. Scallops. Shrimp. Tuna. Tilapia. Clams. Oysters. Eggs. ? Dairy: Low-fat milk. Cheese. Greek yogurt. ? Beverages: Water. Red wine. Herbal tea. ? Fats and oils: Extra virgin olive oil. Avocado oil. Grape seed oil. ? Sweets and desserts: [place] yogurt with honey. Baked apples. Poached pears. Trail mix. ? Seasoning and other foods: Basil. Cilantro. Coriander. Cumin. Mint. Parsley. Sage. Rosemary. Tarragon. Garlic. Oregano. Thyme. Pepper. Balsalmic vinegar. Tahini. Hummus. Tomato sauce. Olives. Mushrooms. ? Limit these ? Grains: Prepackaged pasta or rice dishes. Prepackaged cereal with added sugar. ? Vegetables: Deep fried potatoes (french fries). ? Fruits: Fruit canned in syrup. ? Meats and other protein foods: Beef. Pork. Lamb. Poultry with skin. Hot dogs. Tomasa Blase. ? Dairy: Ice cream. Sour cream. Whole milk. ? Beverages: Juice. Sugar-sweetened soft drinks. Beer. Liquor and spirits. ? Fats and oils: Butter. Canola oil. Vegetable oil. Beef fat (tallow). Lard. ? Sweets and desserts: Cookies. Cakes. Pies. Candy. ? Seasoning and other foods: Mayonnaise.  Premade sauces and marinades. ? The items listed may not be a complete list. Talk with your dietitian about what dietary choices are right for you. Summary  The Mediterranean diet includes both food and lifestyle choices.  Eat a variety of fresh fruits and vegetables, beans, nuts, seeds, and whole grains.  Limit the amount of red meat and sweets that you eat.  Talk with your health care provider about whether it is safe for you to drink red wine in moderation. This means 1 glass a day for nonpregnant women and 2 glasses a day for men. A glass of wine equals 5 oz (150 mL). This information is not intended to replace advice given to you by your health care provider. Make sure you discuss any questions you have with your health care provider. Document Released: 02/19/2016 Document Revised: 03/23/2016 Document Reviewed: 02/19/2016 Elsevier Interactive Patient Education  [employer].

## 2017-12-27 NOTE — Progress Notes (Signed)
Cardiology Office Note   Date:  12/27/2017   ID:  Mary RocherDeborah B Maxwell, DOB Apr 09, 1956, MRN [id number]  PCP:  Richarda OverlieHolland, Richard, MD  Cardiologist:   Chilton Siiffany Norman, MD   Chief Complaint  Patient presents with  . Follow-up    Pt states no Sx.       History of Present Illness: Mary Maxwell is a 62 y.o. female with palpitations and atherosclerosis of the aorta.  who presents for follow up.  Mary Maxwell reported palpitations that began 10/2014. At the time Mary Maxwell was bitten by a tick and had symptoms of Lyme disease. Mary Maxwell has been taking doxycycline since that time and thinks that the symptoms are getting somewhat better. Mary Maxwell had an erythematous rash on her abdomen as well as on her wrists and ankles.  Mary Maxwell wore a 48 hour Holter that showed on PVC and several PACs.  Mary Maxwell noted palpitations several times at which time sinus rhythm was recorded. Mary Maxwell had an abdominal aorta ultrasound that showed no AAA but did reveal aortoiliac atherosclerosis without stenosis.    Mary Maxwell has been doing well.  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  They mostly cook at home.  Mary Maxwell wants to lose weight and thinks her carb intake is high.  Mary Maxwell is very active with gardening but doesn't get much formal exercise.  Mary Maxwell denies lower extremity edema, orthopnea or PND.  Mary Maxwell is otherwise well and without complaint.    Past Medical History:  Diagnosis Date  . Atherosclerosis of aorta (HCC) 09/21/2017    History reviewed. No pertinent surgical history.   Current Outpatient Medications  Medication Sig Dispense Refill  . CLIMARA 0.05 MG/24HR patch Take 0.05 mg by mouth daily.    Marland Kitchen. doxycycline (VIBRAMYCIN) 100 MG capsule Take 100 mg by mouth daily.  5   No current facility-administered medications for this visit.     Allergies:   Patient has no known allergies.    Social History:  The patient  reports that Mary Maxwell has never smoked. Mary Maxwell has never used smokeless  tobacco. Mary Maxwell reports that Mary Maxwell drinks about 3.0 - 4.2 oz of alcohol per week. Mary Maxwell reports that Mary Maxwell does not use drugs.   Family History:  The patient's family history includes AAA (abdominal aortic aneurysm) in her mother; Benign prostatic hyperplasia in her brother; Cancer in her paternal grandmother; Dementia in her mother; Heart attack in her father and maternal grandfather; Heart disease in her father; Leukemia in her paternal grandfather.    ROS:  Please see the history of present illness.   Otherwise, review of systems are positive for none.   All other systems are reviewed and negative.    PHYSICAL EXAM: VS:  BP 121/79   Pulse 65   Ht 5' 5.5" (1.664 m)   Wt 146 lb 9.6 oz (66.5 kg)   BMI 24.02 kg/m  , BMI Body mass index is 24.02 kg/m. GENERAL:  Well appearing HEENT: Pupils equal round and reactive, fundi not visualized, oral mucosa unremarkable NECK:  No jugular venous distention, waveform within normal limits, carotid upstroke brisk and symmetric, no bruits LUNGS:  Clear to auscultation bilaterally HEART:  RRR.  PMI not displaced or sustained,S1 and S2 within normal limits, no S3, no S4, no clicks, no rubs, no murmurs ABD:  Flat, positive bowel sounds normal in frequency in pitch, no bruits, no rebound, no guarding, no midline pulsatile mass, no hepatomegaly, no  splenomegaly EXT:  2 plus pulses throughout, no edema, no cyanosis no clubbing SKIN:  No rashes no nodules NEURO:  Cranial nerves II through XII grossly intact, motor grossly intact throughout PSYCH:  Cognitively intact, oriented to person place and time   EKG:  EKG is not ordered today. The ekg ordered 03/20/15 demonstrates sinus bradycardia at 58 bpm.  Low voltage limb and precordial leads. 09/21/17: Sinus rhythm.  Rate 64 bpm.    48 hour Holter 03/28/15: Predominant rhythm: sinus rhythm, sinus arrhythmia Average heart rate: 70 bpm Max heart rate: 96 bpm Min heart rate: 52 bpm Pauses >2.5 seconds: 0 Ventricular  ectopics: 1 PVC Supraventricular ectopics: 27 Patient did not submit a symptom diary.  Mary Maxwell reported heart fluttering and sinus arrhythmia was noted.     Recent Labs: 12/20/2017: ALT 12; BUN 17; Creatinine, Ser 0.87; Potassium 4.2; Sodium 141    Lipid Panel    Component Value Date/Time   CHOL 224 (H) 12/20/2017 1102   TRIG 79 12/20/2017 1102   HDL 70 12/20/2017 1102   CHOLHDL 3.2 12/20/2017 1102   CHOLHDL 3.0 03/26/2015 1302   VLDL 11 03/26/2015 1302   LDLCALC 138 (H) 12/20/2017 1102      Wt Readings from Last 3 Encounters:  12/27/17 146 lb 9.6 oz (66.5 kg)  09/21/17 147 lb 12.8 oz (67 kg)  03/20/15 135 lb (61.2 kg)      Other studies Reviewed: Additional studies/ records that were reviewed today include: . Review of the above records demonstrates:  Please see elsewhere in the note.     ASSESSMENT AND PLAN:  # Palpitations: Resolved.  PACs and PVCs on Holter.  # Family history of AAA: No evidence of AAA on ultrasound.  # Atherosclerosis of the aorta: Mild atherosclerosis without stenosis.  Mary Maxwell is asymptomatic and otherwise in good health.  Mary Maxwell has not received any treatment in the past.   # Elevated lipids: Lipids remain elevated.  Mary Maxwell will work on a Mediterranean diet and increase exercise.  Consider statin if LDL remains >70.  Consider aspirin.    # Elevated BP: BP well-controlled.  Mary Maxwell thinks it was previously elevated due to lack of insurance.   Current medicines are reviewed at length with the patient today.  The patient does not have concerns regarding medicines.  The following changes have been made:  no change  Labs/ tests ordered today include:   Orders Placed This Encounter  Procedures  . Lipid panel      Disposition:   FU with Dr. Elmarie Shiley C. [place] in 6 months.    Signed, Chilton Si, MD  12/27/2017 10:31 AM    [facility]

## 2019-02-22 ENCOUNTER — Other Ambulatory Visit: Payer: Self-pay

## 2019-02-22 DIAGNOSIS — Z20822 Contact with and (suspected) exposure to covid-19: Secondary | ICD-10-CM

## 2019-02-24 LAB — NOVEL CORONAVIRUS, NAA: SARS-CoV-2, NAA: NOT DETECTED

## 2019-02-24 LAB — SPECIMEN STATUS REPORT

## 2020-02-26 ENCOUNTER — Telehealth: Payer: Self-pay | Admitting: Cardiovascular Disease

## 2020-02-26 NOTE — Telephone Encounter (Signed)
LVM for patient to return call to get follow up scheduled with [place] from recall list 

## 2020-05-01 ENCOUNTER — Telehealth: Payer: Self-pay | Admitting: *Deleted

## 2020-05-01 DIAGNOSIS — Z13220 Encounter for screening for lipoid disorders: Secondary | ICD-10-CM

## 2020-05-01 NOTE — Telephone Encounter (Signed)
Lab orders placed for LP/CMET for upcoming visit

## 2020-05-02 LAB — LIPID PANEL
Chol/HDL Ratio: 3.5 ratio (ref 0.0–4.4)
Cholesterol, Total: 214 mg/dL — ABNORMAL HIGH (ref 100–199)
HDL: 61 mg/dL (ref >39)
LDL Chol Calc (NIH): 137 mg/dL — ABNORMAL HIGH (ref 0–99)
Triglycerides: 88 mg/dL (ref 0–149)
VLDL Cholesterol Cal: 16 mg/dL (ref 5–40)

## 2020-05-02 LAB — COMPREHENSIVE METABOLIC PANEL
ALT: 13 IU/L (ref 0–32)
AST: 18 IU/L (ref 0–40)
Albumin/Globulin Ratio: 1.7 (ref 1.2–2.2)
Albumin: 4.3 g/dL (ref 3.8–4.8)
Alkaline Phosphatase: 70 IU/L (ref 44–121)
BUN/Creatinine Ratio: 12 (ref 12–28)
BUN: 11 mg/dL (ref 8–27)
Bilirubin Total: 0.5 mg/dL (ref 0.0–1.2)
CO2: 26 mmol/L (ref 20–29)
Calcium: 9.4 mg/dL (ref 8.7–10.3)
Chloride: 101 mmol/L (ref 96–106)
Creatinine, Ser: 0.89 mg/dL (ref 0.57–1.00)
GFR calc Af Amer: 79 mL/min/{1.73_m2} (ref >59)
GFR calc non Af Amer: 68 mL/min/{1.73_m2} (ref >59)
Globulin, Total: 2.5 g/dL (ref 1.5–4.5)
Glucose: 80 mg/dL (ref 65–99)
Potassium: 4.4 mmol/L (ref 3.5–5.2)
Sodium: 140 mmol/L (ref 134–144)
Total Protein: 6.8 g/dL (ref 6.0–8.5)

## 2020-05-06 ENCOUNTER — Ambulatory Visit (INDEPENDENT_AMBULATORY_CARE_PROVIDER_SITE_OTHER): Payer: Medicare Other | Admitting: Cardiovascular Disease

## 2020-05-06 ENCOUNTER — Other Ambulatory Visit: Payer: Self-pay

## 2020-05-06 ENCOUNTER — Encounter: Payer: Self-pay | Admitting: Cardiovascular Disease

## 2020-05-06 DIAGNOSIS — E7800 Pure hypercholesterolemia, unspecified: Secondary | ICD-10-CM | POA: Diagnosis not present

## 2020-05-06 DIAGNOSIS — I10 Essential (primary) hypertension: Secondary | ICD-10-CM | POA: Diagnosis not present

## 2020-05-06 HISTORY — DX: Pure hypercholesterolemia, unspecified: E78.00

## 2020-05-06 HISTORY — DX: Essential (primary) hypertension: I10

## 2020-05-06 NOTE — Progress Notes (Signed)
Cardiology Office Note  Date:  05/06/2020   ID:  Mary Maxwell, DOB April 16, 1956, MRN [id number]  PCP:  Richarda Overlie, MD  Cardiologist:   Chilton Si, MD   No chief complaint on file.    History of Present Illness: Mary Maxwell is a 64 y.o. female with hypertension, hyperlipidemia, palpitations and atherosclerosis of the aorta.  who presents for follow up.  Mary Maxwell reported palpitations that began 10/2014. At the time she was bitten by a tick and had symptoms of Lyme disease. She has been taking doxycycline since that time and thinks that the symptoms are getting somewhat better. She had an erythematous rash on her abdomen as well as on her wrists and ankles.  She wore a 48 hour Holter that showed on PVC and several PACs.  She noted palpitations several times at which time sinus rhythm was recorded. She had an abdominal aorta ultrasound that showed no AAA but did reveal aortoiliac atherosclerosis without stenosis.    Lately she has ben feeling well. She recently started with Medicare and getting enrolled has been frustrating.  Physically she has been doing well.  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 She is active on the farm and has no exertional chest pain or shortness of breath. She reports that her diet is good.  She eats fresh eggs and mostly eats produce form her farm.  She minimizes salt intake.  She has no LE edema, orthopnea or PND.  She mostly eats goat meat.  She has minimal red meat or fast food.  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  She notes that her cholesterol has always been elevated even since she was in her 73s.  Past Medical History:  Diagnosis Date  . Atherosclerosis of aorta (HCC) 09/21/2017  . Essential hypertension 05/06/2020  . Pure hypercholesterolemia 05/06/2020    History reviewed. No pertinent surgical history.   Current Outpatient Medications  Medication Sig Dispense Refill  . CLIMARA 0.05 MG/24HR patch Take 0.05 mg by mouth  daily.    Marland Kitchen losartan (COZAAR) 100 MG tablet Take 100 mg by mouth daily.     No current facility-administered medications for this visit.    Allergies:   Sulfa antibiotics and Sulfamethoxazole    Social History:  The patient  reports that she has never smoked. She has never used smokeless tobacco. She reports current alcohol use of about 5.0 - 7.0 standard drinks of alcohol per week. She reports that she does not use drugs.   Family History:  The patient's family history includes AAA (abdominal aortic aneurysm) in her mother; Benign prostatic hyperplasia in her brother; Cancer in her paternal grandmother; Dementia in her mother; Heart attack in her father and maternal grandfather; Heart disease in her father; Leukemia in her paternal grandfather.    ROS:  Please see the history of present illness.   Otherwise, review of systems are positive for none.   All other systems are reviewed and negative.    PHYSICAL EXAM: VS:  BP 124/76   Pulse 63   Ht 5\' 6"  (1.676 m)   Wt 144 lb 6.4 oz (65.5 kg)   LMP  (LMP Unknown)   SpO2 97%   BMI 23.31 kg/m  , BMI Body mass index is 23.31 kg/m. GENERAL:  Well appearing HEENT: Pupils equal round and reactive, fundi not visualized, oral mucosa unremarkable NECK:  No jugular venous distention, waveform within normal limits, carotid upstroke brisk and symmetric, no bruits LUNGS:  Clear to auscultation bilaterally HEART:  RRR.  PMI not displaced or sustained,S1 and S2 within normal limits, no S3, no S4, no clicks, no rubs, no murmurs ABD:  Flat, positive bowel sounds normal in frequency in pitch, no bruits, no rebound, no guarding, no midline pulsatile mass, no hepatomegaly, no splenomegaly EXT:  2 plus pulses throughout, no edema, no cyanosis no clubbing SKIN:  No rashes no nodules NEURO:  Cranial nerves II through XII grossly intact, motor grossly intact throughout PSYCH:  Cognitively intact, oriented to person place and time   EKG:  EKG is ordered  today. The ekg ordered 03/20/15 demonstrates sinus bradycardia at 58 bpm.  Low voltage limb and precordial leads. 09/21/17: Sinus rhythm.  Rate 64 bpm.   05/06/20: Sinus rhythm.  Rate 63 bpm.    48 hour Holter 03/28/15: Predominant rhythm: sinus rhythm, sinus arrhythmia Average heart rate: 70 bpm Max heart rate: 96 bpm Min heart rate: 52 bpm Pauses >2.5 seconds: 0 Ventricular ectopics: 1 PVC Supraventricular ectopics: 27 Patient did not submit a symptom diary.  She reported heart fluttering and sinus arrhythmia was noted.    Recent Labs: 05/01/2020: ALT 13; BUN 11; Creatinine, Ser 0.89; Potassium 4.4; Sodium 140    Lipid Panel    Component Value Date/Time   CHOL 214 (H) 05/01/2020 1206   TRIG 88 05/01/2020 1206   HDL 61 05/01/2020 1206   CHOLHDL 3.5 05/01/2020 1206   CHOLHDL 3.0 03/26/2015 1302   VLDL 11 03/26/2015 1302   LDLCALC 137 (H) 05/01/2020 1206      Wt Readings from Last 3 Encounters:  05/06/20 144 lb 6.4 oz (65.5 kg)  12/27/17 146 lb 9.6 oz (66.5 kg)  09/21/17 147 lb 12.8 oz (67 kg)      Other studies Reviewed: Additional studies/ records that were reviewed today include: . Review of the above records demonstrates:  Please see elsewhere in the note.     ASSESSMENT AND PLAN:  # Hypertension: BP is well-controlled on losartan.  # Palpitations: Resolved.  PACs and PVCs on Holter.  # Family history of AAA: No evidence of AAA on ultrasound.  # Atherosclerosis of the aorta: Mild atherosclerosis without stenosis.  She is asymptomatic and otherwise in good health.  We discussed lipid lowering therapy and she is afraid of statins because her aunt had liver cancer. She would like to get a coronary calcium score to better understand her risk.  Based on this information she will decide whether she wants to start a statin.  # Elevated lipids: Lipids remain elevated.  Her LDL goal is <70.   Consider statin if LDL remains >70.  Calcium score as above.   Current  medicines are reviewed at length with the patient today.  The patient does not have concerns regarding medicines.  The following changes have been made:  no change  Labs/ tests ordered today include:   Orders Placed This Encounter  Procedures  . CT CARDIAC SCORING  . EKG 12-Lead   Time spent: 35 minutes-Greater than 50% of this time was spent in counseling, explanation of diagnosis, planning of further management, and coordination of care.    Disposition:   FU with Dr. Elmarie Shiley C. [facility] in 1 year    Signed, Chilton Si, MD  05/06/2020 4:25 PM    [facility]

## 2020-05-06 NOTE — Patient Instructions (Signed)
Medication Instructions:  Your physician recommends that you continue on your current medications as directed. Please refer to the Current Medication list given to you today.  *If you need a refill on your cardiac medications before your next appointment, please call your pharmacy*  Lab Work: NONE  Testing/Procedures: CALCIUM SCORE  THIS WILL COST YOU $150 OUT OF POCKET  Follow-Up: At [facility], you and your health needs are our priority.  As part of our continuing mission to provide you with exceptional heart care, we have created designated Provider Care Teams.  These Care Teams include your primary Cardiologist (physician) and Advanced Practice Providers (APPs -  Physician Assistants and Nurse Practitioners) who all work together to provide you with the care you need, when you need it.  We recommend signing up for the patient portal called "MyChart".  Sign up information is provided on this After Visit Summary.  MyChart is used to connect with patients for Virtual Visits (Telemedicine).  Patients are able to view lab/test results, encounter notes, upcoming appointments, etc.  Non-urgent messages can be sent to your provider as well.   To learn more about what you can do with MyChart, go to [email or web address].    Your next appointment:   12 month(s)  You will receive a reminder letter in the mail two months in advance. If you don't receive a letter, please call our office to schedule the follow-up appointment.  The format for your next appointment:   In Person  Provider:   You may see DR [facility] or one of the following Advanced Practice Providers on your designated Care Team:    Corine Shelter, PA-C  [place], [place]  Edd Fabian, [place]

## 2020-05-22 ENCOUNTER — Ambulatory Visit (INDEPENDENT_AMBULATORY_CARE_PROVIDER_SITE_OTHER)
Admission: RE | Admit: 2020-05-22 | Discharge: 2020-05-22 | Disposition: A | Discharge status: Home / Self Care | Payer: Self-pay | Source: Ambulatory Visit | Attending: Cardiovascular Disease | Admitting: Cardiovascular Disease

## 2020-05-22 ENCOUNTER — Other Ambulatory Visit: Payer: Self-pay

## 2020-05-22 DIAGNOSIS — E7800 Pure hypercholesterolemia, unspecified: Secondary | ICD-10-CM

## 2020-05-27 ENCOUNTER — Telehealth: Payer: Self-pay | Admitting: *Deleted

## 2020-05-27 DIAGNOSIS — I10 Essential (primary) hypertension: Secondary | ICD-10-CM

## 2020-05-27 DIAGNOSIS — E7800 Pure hypercholesterolemia, unspecified: Secondary | ICD-10-CM

## 2020-05-27 DIAGNOSIS — Z5181 Encounter for therapeutic drug level monitoring: Secondary | ICD-10-CM

## 2020-05-27 MED ORDER — ROSUVASTATIN CALCIUM 20 MG PO TABS: 20.0000 mg | ORAL_TABLET | Freq: Every day | ORAL | 1 refills | Status: DC

## 2020-05-27 NOTE — Telephone Encounter (Signed)
Released in my chart with Dr Leonides Sake comments attached and detailed message on machine, ok per [facility]

## 2020-05-27 NOTE — Telephone Encounter (Signed)
-----   Message from Chilton Si, MD sent at 05/26/2020  3:31 PM EST ----- Rosuvastatin 20mg  as in CT-A report.

## 2020-05-27 NOTE — Telephone Encounter (Signed)
-----   Message from Chilton Si, MD sent at 05/26/2020  3:24 PM EST ----- There is a small amount of calcification.  Given that there is plaque in the coronaries and aorta, would recommend statin as discussed in clinic.  Rosuvastatin 20mg  if she is willing. Repeat lipids/CMP in 2-3 months.

## 2020-05-27 NOTE — Telephone Encounter (Signed)
Discussed labs with patient

## 2020-05-27 NOTE — Telephone Encounter (Signed)
Patient returning call.

## 2021-03-18 ENCOUNTER — Ambulatory Visit: Payer: Medicare Other | Admitting: Internal Medicine

## 2021-03-18 ENCOUNTER — Encounter: Payer: Self-pay | Admitting: Internal Medicine

## 2021-03-18 ENCOUNTER — Other Ambulatory Visit: Payer: Self-pay

## 2021-03-18 VITALS — BP 136/76 | HR 62 | Ht 65.0 in | Wt 140.0 lb

## 2021-03-18 DIAGNOSIS — I10 Essential (primary) hypertension: Secondary | ICD-10-CM | POA: Diagnosis not present

## 2021-03-18 DIAGNOSIS — I2510 Atherosclerotic heart disease of native coronary artery without angina pectoris: Secondary | ICD-10-CM

## 2021-03-18 DIAGNOSIS — E7800 Pure hypercholesterolemia, unspecified: Secondary | ICD-10-CM

## 2021-03-18 DIAGNOSIS — Z79899 Other long term (current) drug therapy: Secondary | ICD-10-CM

## 2021-03-18 DIAGNOSIS — Z13220 Encounter for screening for lipoid disorders: Secondary | ICD-10-CM

## 2021-03-18 DIAGNOSIS — I700 Atherosclerosis of aorta: Secondary | ICD-10-CM

## 2021-03-18 DIAGNOSIS — I2584 Coronary atherosclerosis due to calcified coronary lesion: Secondary | ICD-10-CM

## 2021-03-18 MED ORDER — ROSUVASTATIN CALCIUM 20 MG PO TABS: 20.0000 mg | ORAL_TABLET | Freq: Every day | ORAL | 3 refills | Status: DC

## 2021-03-18 NOTE — Patient Instructions (Signed)
Medication Instructions:  No Changes In Medications at this time.  *If you need a refill on your cardiac medications before your next appointment, please call your pharmacy*  Lab Work: PLEASE RETURN FOR LAB WORK- IN 6-8 WEEKS If you have labs (blood work) drawn today and your tests are completely normal, you will receive your results only by: MyChart Message (if you have MyChart) OR A paper copy in the mail If you have any lab test that is abnormal or we need to change your treatment, we will call you to review the results.  Follow-Up: At [facility], you and your health needs are our priority.  As part of our continuing mission to provide you with exceptional heart care, we have created designated Provider Care Teams.  These Care Teams include your primary Cardiologist (physician) and Advanced Practice Providers (APPs -  Physician Assistants and Nurse Practitioners) who all work together to provide you with the care you need, when you need it.  We recommend signing up for the patient portal called "MyChart".  Sign up information is provided on this After Visit Summary.  MyChart is used to connect with patients for Virtual Visits (Telemedicine).  Patients are able to view lab/test results, encounter notes, upcoming appointments, etc.  Non-urgent messages can be sent to your provider as well.   To learn more about what you can do with MyChart, go to [email or web address].    Your next appointment:   1 year(s)  The format for your next appointment:   In Person  Provider:   Weston Brass, MD

## 2021-03-18 NOTE — Progress Notes (Signed)
Cardiology Office Note:    Date:  03/18/2021   ID:  Mary Maxwell, DOB September 20, 1955, MRN [id number]  PCP:  Richarda Overlie, MD  Cardiologist:  None  Electrophysiologist:  None   Referring MD: Richarda Overlie, MD   Chief Complaint/Reason for Referral: HTN, HLD, CAC  History of Present Illness:    Mary Maxwell is a 65 y.o. female with a history of hypertension, hyperlipidemia, palpitations, coronary artery calcifications and atherosclerosis of the aorta.    Feeling well. [place], husband cooks rich foods which may be contributing to elevated LDL. Started Crestor 20 mg daily last year and tolerating well. Has not had repeat lipids since then. Will check cholesterol 6-8 weeks, she would like some time to improve her diet before seeing where LDL stands. We discussed dietary modifications which may help including reducing animal fats in diet such as egg yolks or ham products.   The patient denies chest pain, chest pressure, dyspnea at rest or with exertion, palpitations, PND, orthopnea, or leg swelling. Denies cough, fever, chills. Denies nausea, vomiting. Denies syncope or presyncope. Denies dizziness or lightheadedness. Denies snoring.   Past Medical History:  Diagnosis Date   Atherosclerosis of aorta (HCC) 09/21/2017   Essential hypertension 05/06/2020   Pure hypercholesterolemia 05/06/2020    No past surgical history on file.  Current Medications: Current Meds  Medication Sig   estradiol (VIVELLE-DOT) 0.05 MG/24HR patch Place 1 patch onto the skin 2 (two) times a week.   losartan (COZAAR) 100 MG tablet Take 100 mg by mouth daily.   [DISCONTINUED] rosuvastatin (CRESTOR) 20 MG tablet Take 1 tablet (20 mg total) by mouth daily.     Allergies:   Sulfa antibiotics and Sulfamethoxazole   Social History   Tobacco Use   Smoking status: Never   Smokeless tobacco: Never  Substance Use Topics   Alcohol use: Yes    Alcohol/week: 5.0 - 7.0 standard drinks     Types: 5 - 7 Glasses of wine per week   Drug use: No     Family History: The patient's family history includes AAA (abdominal aortic aneurysm) in her mother; Benign prostatic hyperplasia in her brother; Cancer in her paternal grandmother; Dementia in her mother; Heart attack in her father and maternal grandfather; Heart disease in her father; Leukemia in her paternal grandfather.  ROS:   Please see the history of present illness.    All other systems reviewed and are negative.  EKGs/Labs/Other Studies Reviewed:    The following studies were reviewed today:  EKG:  NSR, poor R wave progression  Imaging studies that I have independently reviewed today: Cor cal CT 05/22/20 - calcium in LAD  Recent Labs: 05/01/2020: ALT 13; BUN 11; Creatinine, Ser 0.89; Potassium 4.4; Sodium 140  Recent Lipid Panel    Component Value Date/Time   CHOL 214 (H) 05/01/2020 1206   TRIG 88 05/01/2020 1206   HDL 61 05/01/2020 1206   CHOLHDL 3.5 05/01/2020 1206   CHOLHDL 3.0 03/26/2015 1302   VLDL 11 03/26/2015 1302   LDLCALC 137 (H) 05/01/2020 1206    Physical Exam:    VS:  BP 136/76 (BP Location: Left Arm, Patient Position: Sitting, Cuff Size: Normal)   Pulse 62   Ht 5\' 5"  (1.651 m)   Wt 140 lb (63.5 kg)   LMP  (LMP Unknown)   SpO2 98%   BMI 23.30 kg/m     Wt Readings from Last 5 Encounters:  03/18/21 140 lb (  63.5 kg)  05/06/20 144 lb 6.4 oz (65.5 kg)  12/27/17 146 lb 9.6 oz (66.5 kg)  09/21/17 147 lb 12.8 oz (67 kg)  03/20/15 135 lb (61.2 kg)    Constitutional: No acute distress Eyes: sclera non-icteric, normal conjunctiva and lids ENMT: normal dentition, moist mucous membranes Cardiovascular: regular rhythm, normal rate, no murmurs. S1 and S2 normal. Radial pulses normal bilaterally. No jugular venous distention.  Respiratory: clear to auscultation bilaterally GI : normal bowel sounds, soft and nontender. No distention.   MSK: extremities warm, well perfused. No edema.  NEURO:  grossly nonfocal exam, moves all extremities. PSYCH: alert and oriented x 3, normal mood and affect.   ASSESSMENT:    1. Pure hypercholesterolemia   2. Essential hypertension   3. Need for lipid screening   4. Atherosclerosis of aorta (HCC)   5. Coronary artery calcification   6. Medication management    PLAN:    Pure hypercholesterolemia - Plan: EKG 12-Lead, Lipid panel - continue crestor 20 mg daily. Lipids pending in 6-8 weeks after dietary modifications.   Essential hypertension - Plan: EKG 12-Lead - BP mildly elevated. Discussed starting amlodipine for better BP control. She would like to adjust diet and monitor BP at home before starting a new medication. Agreeable to this, this can be adjusted by PCP as needed. Continue losartan 100 mg daily.   Need for lipid screening - as above.   Atherosclerosis of aorta (HCC) Coronary artery calcification - continue crestor 20 mg daily. No red flag symptoms. Cor cal score 6.5, 57th percentile.   Total time of encounter: 30 minutes total time of encounter, including 21 minutes spent in face-to-face patient care on the date of this encounter. This time includes coordination of care and counseling regarding above mentioned problem list. Remainder of non-face-to-face time involved reviewing chart documents/testing relevant to the patient encounter and documentation in the medical record. I have independently reviewed documentation from referring provider.   Weston Brass, MD, [facility] [place]  [facility]   Shared Decision Making/Informed Consent:       Medication Adjustments/Labs and Tests Ordered: Current medicines are reviewed at length with the patient today.  Concerns regarding medicines are outlined above.   Orders Placed This Encounter  Procedures   Lipid panel   EKG 12-Lead    Meds ordered this encounter  Medications   rosuvastatin (CRESTOR) 20 MG tablet    Sig: Take 1 tablet (20 mg total) by mouth daily.     Dispense:  90 tablet    Refill:  3    Patient Instructions  Medication Instructions:  No Changes In Medications at this time.  *If you need a refill on your cardiac medications before your next appointment, please call your pharmacy*  Lab Work: PLEASE RETURN FOR LAB WORK- IN 6-8 WEEKS If you have labs (blood work) drawn today and your tests are completely normal, you will receive your results only by: MyChart Message (if you have MyChart) OR A paper copy in the mail If you have any lab test that is abnormal or we need to change your treatment, we will call you to review the results.  Follow-Up: At [facility], you and your health needs are our priority.  As part of our continuing mission to provide you with exceptional heart care, we have created designated Provider Care Teams.  These Care Teams include your primary Cardiologist (physician) and Advanced Practice Providers (APPs -  Physician Assistants and Nurse Practitioners) who all work together  to provide you with the care you need, when you need it.  We recommend signing up for the patient portal called "MyChart".  Sign up information is provided on this After Visit Summary.  MyChart is used to connect with patients for Virtual Visits (Telemedicine).  Patients are able to view lab/test results, encounter notes, upcoming appointments, etc.  Non-urgent messages can be sent to your provider as well.   To learn more about what you can do with MyChart, go to [email or web address].    Your next appointment:   1 year(s)  The format for your next appointment:   In Person  Provider:   Weston Brass, MD

## 2021-08-14 ENCOUNTER — Other Ambulatory Visit: Payer: Self-pay | Admitting: Internal Medicine

## 2021-08-14 DIAGNOSIS — E7800 Pure hypercholesterolemia, unspecified: Secondary | ICD-10-CM

## 2021-09-15 ENCOUNTER — Ambulatory Visit
Admission: RE | Admit: 2021-09-15 | Discharge: 2021-09-15 | Disposition: A | Discharge status: Home / Self Care | Payer: No Typology Code available for payment source | Source: Ambulatory Visit | Attending: Internal Medicine | Admitting: Internal Medicine

## 2021-09-15 DIAGNOSIS — E7800 Pure hypercholesterolemia, unspecified: Secondary | ICD-10-CM

## 2022-03-25 ENCOUNTER — Other Ambulatory Visit: Payer: Self-pay | Admitting: Internal Medicine

## 2022-03-29 NOTE — Telephone Encounter (Signed)
?*  STAT* If patient is at the pharmacy, call can be transferred to refill team. ? ? ?1. Which medications need to be refilled? (please list name of each medication and dose if known) rosuvastatin (CRESTOR) 20 MG tablet ? ?2. Which pharmacy/location (including street and city if local pharmacy) is medication to be sent to? [id number] - [place], [place] - [address] ? ?3. Do they need a 30 day or 90 day supply? 90 day  ? ?Patient is out of medication.  ?

## 2022-04-21 ENCOUNTER — Other Ambulatory Visit: Payer: Self-pay | Admitting: Internal Medicine

## 2022-05-06 ENCOUNTER — Telehealth: Payer: Self-pay | Admitting: Internal Medicine

## 2022-05-06 MED ORDER — ROSUVASTATIN CALCIUM 20 MG PO TABS: 20.0000 mg | ORAL_TABLET | Freq: Every day | ORAL | 0 refills | Status: DC

## 2022-05-06 NOTE — Telephone Encounter (Signed)
*  STAT* If patient is at the pharmacy, call can be transferred to refill team.   1. Which medications need to be refilled? (please list name of each medication and dose if known)   rosuvastatin (CRESTOR) 20 MG tablet  2. Which pharmacy/location (including street and city if local pharmacy) is medication to be sent to?  [id number] - [place], [place] - [address] AT [place]  3. Do they need a 30 day or 90 day supply?  90 day  Patient stated she has two tablets left.

## 2022-05-07 ENCOUNTER — Other Ambulatory Visit: Payer: Self-pay | Admitting: Internal Medicine

## 2022-08-13 ENCOUNTER — Telehealth: Payer: Self-pay | Admitting: Internal Medicine

## 2022-08-13 MED ORDER — ROSUVASTATIN CALCIUM 20 MG PO TABS: 20.0000 mg | ORAL_TABLET | Freq: Every day | ORAL | 0 refills | Status: DC

## 2022-08-13 NOTE — Telephone Encounter (Signed)
Rx(s) sent to pharmacy electronically.  

## 2022-08-13 NOTE — Telephone Encounter (Signed)
*  STAT* If patient is at the pharmacy, call can be transferred to refill team.   1. Which medications need to be refilled? (please list name of each medication and dose if known) rosuvastatin (CRESTOR) 20 MG tablet  2. Which pharmacy/location (including street and city if local pharmacy) is medication to be sent to? [id number] - [place], [place] - [address] AT [place]   3. Do they need a 30 day or 90 day supply? [place]

## 2022-08-25 ENCOUNTER — Ambulatory Visit: Payer: Medicare Other | Admitting: Internal Medicine

## 2022-09-23 ENCOUNTER — Ambulatory Visit: Payer: Medicare Other | Attending: Internal Medicine | Admitting: Internal Medicine

## 2022-09-23 ENCOUNTER — Encounter: Payer: Self-pay | Admitting: Internal Medicine

## 2022-09-23 VITALS — BP 138/72 | HR 61 | Ht 65.0 in | Wt 131.0 lb

## 2022-09-23 DIAGNOSIS — I2510 Atherosclerotic heart disease of native coronary artery without angina pectoris: Secondary | ICD-10-CM

## 2022-09-23 DIAGNOSIS — I700 Atherosclerosis of aorta: Secondary | ICD-10-CM

## 2022-09-23 DIAGNOSIS — I447 Left bundle-branch block, unspecified: Secondary | ICD-10-CM | POA: Diagnosis not present

## 2022-09-23 DIAGNOSIS — E7800 Pure hypercholesterolemia, unspecified: Secondary | ICD-10-CM

## 2022-09-23 DIAGNOSIS — I2584 Coronary atherosclerosis due to calcified coronary lesion: Secondary | ICD-10-CM

## 2022-09-23 DIAGNOSIS — R9431 Abnormal electrocardiogram [ECG] [EKG]: Secondary | ICD-10-CM | POA: Diagnosis not present

## 2022-09-23 NOTE — Patient Instructions (Signed)
Medication Instructions:  No Changes In Medications at this time.  *If you need a refill on your cardiac medications before your next appointment, please call your pharmacy*  Lab Work: BLOOD WORK TODAY If you have labs (blood work) drawn today and your tests are completely normal, you will receive your results only by: Mary Maxwell (if you have MyChart) OR A paper copy in the mail If you have any lab test that is abnormal or we need to change your treatment, we will call you to review the results.  Testing/Procedures: Your physician has requested that you have cardiac CT. Cardiac computed tomography (CT) is a painless test that uses an x-ray machine to take clear, detailed pictures of your heart. For further information please visit [email or web address]. Please follow instruction sheet as given.   Follow-Up: At [facility], you and your health needs are our priority.  As part of our continuing mission to provide you with exceptional heart care, we have created designated Provider Care Teams.  These Care Teams include your primary Cardiologist (physician) and Advanced Practice Providers (APPs -  Physician Assistants and Nurse Practitioners) who all work together to provide you with the care you need, when you need it.  Your next appointment:   6 month(s)  Provider:   [name], MD       Your cardiac CT will be scheduled at one of the below locations:   [facility] [address] [place], [place] [address] [phone]  If scheduled at [facility], please arrive at the [facility] Entrance (Entrance C2) of [facility] 30 minutes prior to test start time. You can use the FREE valet parking offered at entrance C (encouraged to control the heart rate for the test)  Proceed to the [facility] Radiology Department (first floor) to check-in and test prep.  All radiology patients and guests should use entrance C2 at [facility], accessed from [facility], even though the hospital's physical address listed is [address].     Please follow these instructions carefully (unless otherwise directed):  Hold all erectile dysfunction medications at least 3 days (72 hrs) prior to test. (Ie viagra, cialis, sildenafil, tadalafil, etc) We will administer nitroglycerin during this exam.   On the Night Before the Test: Be sure to Drink plenty of water. Do not consume any caffeinated/decaffeinated beverages or chocolate 12 hours prior to your test. Do not take any antihistamines 12 hours prior to your test  On the Day of the Test: Drink plenty of water until 1 hour prior to the test. Do not eat any food 1 hour prior to test. You may take your regular medications prior to the test.  If you take Furosemide/Hydrochlorothiazide/Spironolactone, please HOLD on the morning of the test. FEMALES- please wear underwire-free bra if available, avoid dresses & tight clothing  After the Test: Drink plenty of water. After receiving IV contrast, you may experience a mild flushed feeling. This is normal. On occasion, you may experience a mild rash up to 24 hours after the test. This is not dangerous. If this occurs, you can take Benadryl 25 mg and increase your fluid intake. If you experience trouble breathing, this can be serious. If it is severe call 911 IMMEDIATELY. If it is mild, please call our office. If you take any of these medications: Glipizide/Metformin, Avandament, Glucavance, please do not take 48 hours after completing test unless otherwise instructed.  We will call  to schedule your test 2-4 weeks out understanding that some insurance companies will need an authorization prior to the service being performed.   For non-scheduling related questions, please contact the cardiac imaging nurse navigator should you have any questions/concerns: Marchia Bond, Cardiac Imaging Nurse Navigator Gordy Clement, Cardiac Imaging Nurse Navigator [facility] Direct Office Dial: [phone]   For scheduling needs, including cancellations and rescheduling, please call [place], [phone].

## 2022-09-23 NOTE — Progress Notes (Signed)
Cardiology Office Note:    Date: 09/23/2022  ID:  Mary Maxwell, DOB 1955-09-11, MRN [id number]  PCP:  Molli Posey, MD  Cardiologist:  [name], MD  Electrophysiologist:  None   Referring MD: Molli Posey, MD   Chief Complaint/Reason for Referral: HTN, HLD, CAC  History of Present Illness:    Mary Maxwell is a 67 y.o. female with a history of hypertension, hyperlipidemia, palpitations, coronary artery calcifications and atherosclerosis of the aorta who presents for follow up.  Last visit 03/18/21 started Crestor 20 mg daily last year and was tolerating well at the time.  Today, the patient states that she has been doing very well. Lately she has less energy. She is still very active due to housework, and taking care of her garden and chickens. She doesn't feel limited at all by her cardiac health.  Her blood pressure in clinic is 138/72 which she states is abnormal for her. It is usually lower in the 120's, she attributes the increase due to traffic and stress of getting to the doctors office.  Her macular degeneration is her main concern. She had a previous bacterial infection in her eyes which has since resolved.  She has recently developed a left bundle branch block but denies any cardiac symptoms or chest pain. We discussed etiologies for new LBBB.   She denies any palpitations, chest pain, shortness of breath, or peripheral edema. No lightheadedness, headaches, syncope, orthopnea, or PND.  Past Medical History:  Diagnosis Date   Atherosclerosis of aorta ([place]) 09/21/2017   Essential hypertension 05/06/2020   Pure hypercholesterolemia 05/06/2020    No past surgical history on file.  Current Medications: Current Meds  Medication Sig   CLIMARA 0.05 MG/24HR patch Take 0.05 mg by mouth daily.   estradiol (VIVELLE-DOT) 0.05 MG/24HR patch Place 1 patch onto the skin 2 (two) times a week.   losartan (COZAAR) 100 MG tablet Take 100 mg by mouth daily.    rosuvastatin (CRESTOR) 20 MG tablet Take 1 tablet (20 mg total) by mouth daily.     Allergies:   Sulfa antibiotics and Sulfamethoxazole   Social History   Tobacco Use   Smoking status: Never   Smokeless tobacco: Never  Substance Use Topics   Alcohol use: Yes    Alcohol/week: 5.0 - 7.0 standard drinks of alcohol    Types: 5 - 7 Glasses of wine per week   Drug use: No     Family History: The patient's family history includes AAA (abdominal aortic aneurysm) in her mother; Benign prostatic hyperplasia in her brother; Cancer in her paternal grandmother; Dementia in her mother; Heart attack in her father and maternal grandfather; Heart disease in her father; Leukemia in her paternal grandfather.  ROS:   Please see the history of present illness.     All other systems reviewed and are negative.  EKGs/Labs/Other Studies Reviewed:    The following studies were reviewed today:  EKG:  The EKG is personally reviewed 09/23/22 repeat: Sinus bradycardia, LBBB 09/23/22: NSR, LBBB, QR rest duration of 126 ms 03/18/2021: NSR, poor R wave progression  Imaging studies that I have independently reviewed today: Cor cal CT 05/22/20 - calcium in LAD  Recent Labs: 09/23/2022: BUN 17; Creatinine, Ser 0.89; Potassium 4.6; Sodium 142  Recent Lipid Panel    Component Value Date/Time   CHOL 214 (H) 05/01/2020 1206   TRIG 88 05/01/2020 1206   HDL 61 05/01/2020 1206   CHOLHDL 3.5 05/01/2020 1206   CHOLHDL  3.0 03/26/2015 1302   VLDL 11 03/26/2015 1302   LDLCALC 137 (H) 05/01/2020 1206    Physical Exam:    VS:  BP 138/72   Pulse 61   Ht 5\' 5"  (1.651 m)   Wt 131 lb (59.4 kg)   LMP  (LMP Unknown)   SpO2 98%   BMI 21.80 kg/m     Wt Readings from Last 5 Encounters:  09/23/22 131 lb (59.4 kg)  03/18/21 140 lb (63.5 kg)  05/06/20 144 lb 6.4 oz (65.5 kg)  12/27/17 146 lb 9.6 oz (66.5 kg)  09/21/17 147 lb 12.8 oz (67 kg)    Constitutional: No acute distress Eyes: sclera non-icteric, normal  conjunctiva and lids ENMT: normal dentition, moist mucous membranes Cardiovascular: regular rhythm, normal rate, no murmurs. S1 and S2 normal. No jugular venous distention.  Respiratory: clear to auscultation bilaterally GI : normal bowel sounds, soft and nontender. No distention.   MSK: extremities warm, well perfused. No edema.  NEURO: grossly nonfocal exam, moves all extremities. PSYCH: alert and oriented x 3, normal mood and affect.   ASSESSMENT:    1. Pure hypercholesterolemia   2. Abnormal electrocardiogram   3. LBBB (left bundle branch block)   4. Coronary artery calcification   5. Atherosclerosis of aorta (HCC)     PLAN:    Pure hypercholesterolemia - Plan: EKG 12-Lead, Lipid panel - continue crestor 20 mg daily. Lipids relatively well controlled - 09/16/22: LDL 72, Trig 73, HDL 72.   Essential hypertension - Plan: EKG 12-Lead - BP mildly elevated. Continue losartan 100 mg daily. She feels BP today is stress related from rushing in.   Atherosclerosis of aorta (HCC) Coronary artery calcification - continue crestor 20 mg daily. No red flag symptoms. Cor cal score 6.5, 57th percentile  New LBBB - no symptoms, but in setting of CAC would recommend ischemic evaluation. Discussed at length and participated in shared decision making. Plan for coronary CTA. Has not had an echocardiogram, if CCTA doesn't clearly show ischemic disease recommend echocardiogram for continued workup. Discussed why treadmill nuclear stress testing may demonstrate false positive defect in setting of LBBB, therefore would not be our first choice test. Consider PET-CT if we can't do CCTA.   Total time of encounter: 40 minutes total time of encounter, including 25 minutes spent in face-to-face patient care on the date of this encounter. This time includes coordination of care and counseling regarding above mentioned problem list. Remainder of non-face-to-face time involved reviewing chart documents/testing  relevant to the patient encounter and documentation in the medical record. I have independently reviewed documentation from referring provider.   Cherlynn Kaiser, MD, [place]   Shared Decision Making/Informed Consent:       Medication Adjustments/Labs and Tests Ordered: Current medicines are reviewed at length with the patient today.  Concerns regarding medicines are outlined above.   Orders Placed This Encounter  Procedures   CT CORONARY MORPH W/CTA COR W/SCORE W/CA W/CM &/OR WO/CM   Basic metabolic panel   EKG [id number]    No orders of the defined types were placed in this encounter.   Patient Instructions  Medication Instructions:  No Changes In Medications at this time.  *If you need a refill on your cardiac medications before your next appointment, please call your pharmacy*  Lab Work: BLOOD WORK TODAY If you have labs (blood work) drawn today and your tests are completely normal, you will receive your results only by: [facility] (if  you have MyChart) OR A paper copy in the mail If you have any lab test that is abnormal or we need to change your treatment, we will call you to review the results.  Testing/Procedures: Your physician has requested that you have cardiac CT. Cardiac computed tomography (CT) is a painless test that uses an x-ray machine to take clear, detailed pictures of your heart. For further information please visit [email or web address]. Please follow instruction sheet as given.   Follow-Up: At [facility], you and your health needs are our priority.  As part of our continuing mission to provide you with exceptional heart care, we have created designated Provider Care Teams.  These Care Teams include your primary Cardiologist (physician) and Advanced Practice Providers (APPs -  Physician Assistants and Nurse Practitioners) who all work together to provide you with the care you need, when you need it.  Your next  appointment:   6 month(s)  Provider:   [name], MD       Your cardiac CT will be scheduled at one of the below locations:   [facility] [address] [place], [place] [address] [phone]  If scheduled at [facility], please arrive at the [facility] and Children's Entrance (Entrance C2) of [facility] 30 minutes prior to test start time. You can use the FREE valet parking offered at entrance C (encouraged to control the heart rate for the test)  Proceed to the [facility] Radiology Department (first floor) to check-in and test prep.  All radiology patients and guests should use entrance C2 at [facility], accessed from [facility], even though the hospital's physical address listed is [address].     Please follow these instructions carefully (unless otherwise directed):  Hold all erectile dysfunction medications at least 3 days (72 hrs) prior to test. (Ie viagra, cialis, sildenafil, tadalafil, etc) We will administer nitroglycerin during this exam.   On the Night Before the Test: Be sure to Drink plenty of water. Do not consume any caffeinated/decaffeinated beverages or chocolate 12 hours prior to your test. Do not take any antihistamines 12 hours prior to your test  On the Day of the Test: Drink plenty of water until 1 hour prior to the test. Do not eat any food 1 hour prior to test. You may take your regular medications prior to the test.  If you take Furosemide/Hydrochlorothiazide/Spironolactone, please HOLD on the morning of the test. FEMALES- please wear underwire-free bra if available, avoid dresses & tight clothing  After the Test: Drink plenty of water. After receiving IV contrast, you may experience a mild flushed feeling. This is normal. On occasion, you may experience a mild rash up to 24 hours after the test. This is not dangerous. If this occurs, you can take Benadryl 25 mg and increase  your fluid intake. If you experience trouble breathing, this can be serious. If it is severe call 911 IMMEDIATELY. If it is mild, please call our office. If you take any of these medications: Glipizide/Metformin, Avandament, Glucavance, please do not take 48 hours after completing test unless otherwise instructed.  We will call to schedule your test 2-4 weeks out understanding that some insurance companies will need an authorization prior to the service being performed.   For non-scheduling related questions, please contact the cardiac imaging nurse navigator should you have any questions/concerns: Marchia Bond, Cardiac Imaging Nurse Navigator Gordy Clement, Cardiac Imaging Nurse Navigator [facility]  Herbalist Dial: [phone]   For scheduling needs, including cancellations and rescheduling, please call [place], [phone].    I,Coren O'Brien,acting as a Education administrator for [name], MD.,have documented all relevant documentation on the behalf of [name], MD,as directed by  [name], MD while in the presence of [name], MD.  I, [name], MD, have reviewed all documentation for the visit on 09/23/2022. The documentation on today's date of service for the exam, diagnosis, procedures, and orders are all accurate and complete.

## 2022-09-24 LAB — BASIC METABOLIC PANEL
BUN/Creatinine Ratio: 19 (ref 12–28)
BUN: 17 mg/dL (ref 8–27)
CO2: 26 mmol/L (ref 20–29)
Calcium: 9.7 mg/dL (ref 8.7–10.3)
Chloride: 103 mmol/L (ref 96–106)
Creatinine, Ser: 0.89 mg/dL (ref 0.57–1.00)
Glucose: 78 mg/dL (ref 70–99)
Potassium: 4.6 mmol/L (ref 3.5–5.2)
Sodium: 142 mmol/L (ref 134–144)
eGFR: 71 mL/min/{1.73_m2} (ref >59)

## 2022-10-14 ENCOUNTER — Telehealth (HOSPITAL_COMMUNITY): Payer: Self-pay | Admitting: *Deleted

## 2022-10-14 NOTE — Telephone Encounter (Signed)
Reaching out to patient to offer assistance regarding upcoming cardiac imaging study; pt verbalizes understanding of appt date/time, parking situation and where to check in, pre-test NPO status  and verified current allergies; name and call back number provided for further questions should they arise  Carlynn Leduc RN Navigator Cardiac Imaging [facility] [phone] office [phone] cell  Patient aware to arrive at 1:30pm.  

## 2022-10-14 NOTE — Telephone Encounter (Signed)
Attempted to call patient regarding upcoming cardiac CT appointment. °Left message on voicemail with name and callback number ° °Abbagayle Zaragoza RN Navigator Cardiac Imaging °[facility] °[phone] Office °[phone] Cell ° °

## 2022-10-15 ENCOUNTER — Ambulatory Visit (HOSPITAL_COMMUNITY)
Admission: RE | Admit: 2022-10-15 | Discharge: 2022-10-15 | Disposition: A | Discharge status: Home / Self Care | Payer: Medicare Other | Source: Ambulatory Visit | Attending: Internal Medicine | Admitting: Internal Medicine

## 2022-10-15 DIAGNOSIS — R9431 Abnormal electrocardiogram [ECG] [EKG]: Secondary | ICD-10-CM | POA: Insufficient documentation

## 2022-10-15 DIAGNOSIS — I700 Atherosclerosis of aorta: Secondary | ICD-10-CM

## 2022-10-15 MED ORDER — IOHEXOL 350 MG/ML SOLN
95.0000 mL | Freq: Once | INTRAVENOUS | Status: AC | PRN
  Administered 2022-10-15: 95 mL via INTRAVENOUS

## 2022-10-15 MED ORDER — NITROGLYCERIN 0.4 MG SL SUBL
SUBLINGUAL_TABLET | SUBLINGUAL | Status: AC
  Filled 2022-10-15: qty 2

## 2022-10-15 MED ORDER — NITROGLYCERIN 0.4 MG SL SUBL
0.8000 mg | SUBLINGUAL_TABLET | Freq: Once | SUBLINGUAL | Status: AC
  Administered 2022-10-15: 0.8 mg via SUBLINGUAL

## 2022-10-27 ENCOUNTER — Telehealth: Payer: Self-pay | Admitting: Internal Medicine

## 2022-10-27 MED ORDER — ROSUVASTATIN CALCIUM 20 MG PO TABS: 20.0000 mg | ORAL_TABLET | Freq: Every day | ORAL | 3 refills | Status: DC

## 2022-10-27 NOTE — Telephone Encounter (Signed)
Patient is calling for her CT results.  

## 2022-10-27 NOTE — Telephone Encounter (Signed)
Patient given her CT results.  Also sent in her Rosuvastatin as directed by results

## 2022-11-09 ENCOUNTER — Telehealth: Payer: Self-pay | Admitting: Internal Medicine

## 2022-11-09 NOTE — Telephone Encounter (Signed)
Patient is returning call to discuss CT results. 

## 2022-11-09 NOTE — Telephone Encounter (Signed)
Results given to patient and she states understanding.

## 2023-04-07 ENCOUNTER — Encounter: Payer: Self-pay | Admitting: Internal Medicine

## 2023-04-07 ENCOUNTER — Ambulatory Visit: Payer: Medicare Other | Attending: Internal Medicine | Admitting: Internal Medicine

## 2023-04-07 VITALS — BP 130/70 | HR 56 | Ht 65.0 in | Wt 125.8 lb

## 2023-04-07 DIAGNOSIS — I447 Left bundle-branch block, unspecified: Secondary | ICD-10-CM | POA: Diagnosis not present

## 2023-04-07 DIAGNOSIS — I700 Atherosclerosis of aorta: Secondary | ICD-10-CM

## 2023-04-07 DIAGNOSIS — I2584 Coronary atherosclerosis due to calcified coronary lesion: Secondary | ICD-10-CM

## 2023-04-07 DIAGNOSIS — I2510 Atherosclerotic heart disease of native coronary artery without angina pectoris: Secondary | ICD-10-CM | POA: Diagnosis not present

## 2023-04-07 DIAGNOSIS — I10 Essential (primary) hypertension: Secondary | ICD-10-CM

## 2023-04-07 DIAGNOSIS — E7800 Pure hypercholesterolemia, unspecified: Secondary | ICD-10-CM | POA: Diagnosis not present

## 2023-04-07 DIAGNOSIS — R9431 Abnormal electrocardiogram [ECG] [EKG]: Secondary | ICD-10-CM | POA: Diagnosis not present

## 2023-04-07 MED ORDER — ROSUVASTATIN CALCIUM 20 MG PO TABS: 20.0000 mg | ORAL_TABLET | Freq: Every day | ORAL | 3 refills | Status: DC

## 2023-04-07 MED ORDER — LOSARTAN POTASSIUM 100 MG PO TABS: 100.0000 mg | ORAL_TABLET | Freq: Every day | ORAL | 3 refills | Status: DC

## 2023-04-07 NOTE — Patient Instructions (Signed)
Medication Instructions:  Your physician recommends that you continue on your current medications as directed. Please refer to the Current Medication list given to you today.  *If you need a refill on your cardiac medications before your next appointment, please call your pharmacy*  Follow-Up: At [facility], you and your health needs are our priority.  As part of our continuing mission to provide you with exceptional heart care, we have created designated Provider Care Teams.  These Care Teams include your primary Cardiologist (physician) and Advanced Practice Providers (APPs -  Physician Assistants and Nurse Practitioners) who all work together to provide you with the care you need, when you need it.  We recommend signing up for the patient portal called "MyChart".  Sign up information is provided on this After Visit Summary.  MyChart is used to connect with patients for Virtual Visits (Telemedicine).  Patients are able to view lab/test results, encounter notes, upcoming appointments, etc.  Non-urgent messages can be sent to your provider as well.   To learn more about what you can do with MyChart, go to [email or web address].    Your next appointment:   6 month(s)  Provider:   Gayatri A Acharya, MD      

## 2023-04-07 NOTE — Progress Notes (Signed)
Cardiology Office Note:  .   Date:  04/07/2023  ID:  Mary Maxwell, DOB Nov 29, 1955, MRN [id number] PCP: Richarda Overlie, MD  [facility] Providers Cardiologist:  Parke Poisson, MD    History of Present Illness: .   Mary Maxwell is a 67 y.o. female.  Discussed the use of AI scribe software for clinical note transcription with the patient, who gave verbal consent to proceed.  History of Present Illness   The patient, with a history of hypertension, hyperlipidemia, palpitations, coronary artery calcifications, atherosclerosis of the aorta, and macular degeneration, presents for a follow-up visit. She reports feeling well overall. However, she noticed that her prescription for losartan, a medication for her hypertension, was not on her prescription list.  The patient also reports a recent fall that resulted in shoulder and neck pain. She fell backwards onto gravel, injuring her shoulder and neck.  The patient also mentions a severe eye infection earlier in the year, which she suspects was contracted from her grandchildren.  The patient is currently taking estradiol and rosuvastatin, and she also takes vitamin D. She reports that her cholesterol levels are well managed with the rosuvastatin.        ROS: negative except per HPI above.  Studies Reviewed: Marland Kitchen   EKG Interpretation Date/Time:  Thursday April 07 2023 15:00:34 EDT Ventricular Rate:  55 PR Interval:  164 QRS Duration:  80 QT Interval:  450 QTC Calculation: 430 R Axis:   93  Text Interpretation: Sinus bradycardia with sinus arrhythmia Rightward axis Confirmed by Weston Brass ([id number]) on 04/07/2023 3:45:42 PM    Results   LABS LDL: 72 (09/2022) Triglycerides: 73 (09/2022) HDL: 72 (09/2022)  RADIOLOGY Coronary CTA: Minimal calcification in the coronary arteries, minimal CAD, calcium score 10 (55th percentile), aortic atherosclerosis, fatty liver  DIAGNOSTIC EKG: Normal (04/07/2023)      Risk Assessment/Calculations:             Physical Exam:   VS:  BP 130/70 (BP Location: Right Arm, Patient Position: Sitting, Cuff Size: Normal)   Pulse (!) 56   Ht 5\' 5"  (1.651 m)   Wt 125 lb 12.8 oz (57.1 kg)   LMP  (LMP Unknown)   SpO2 97%   BMI 20.93 kg/m    Wt Readings from Last 3 Encounters:  04/07/23 125 lb 12.8 oz (57.1 kg)  09/23/22 131 lb (59.4 kg)  03/18/21 140 lb (63.5 kg)     Physical Exam   VITALS: BP- 130/70 CHEST: lungs clear to auscultation CARDIOVASCULAR: heart sounds normal     GEN: Well nourished, well developed in no acute distress NECK: No JVD; No carotid bruits CARDIAC: RRR, no murmurs, rubs, gallops RESPIRATORY:  Clear to auscultation without rales, wheezing or rhonchi  ABDOMEN: Soft, non-tender, non-distended EXTREMITIES:  No edema; No deformity   ASSESSMENT AND PLAN: .    1. Atherosclerosis of aorta (HCC)   2. Pure hypercholesterolemia   3. Abnormal electrocardiogram   4. LBBB (left bundle branch block)   5. Essential hypertension   6. Coronary artery calcification     Assessment and Plan    Hypertension Unintentional discontinuation of Losartan 100mg  daily due to prescription issues. Blood pressure slightly elevated at current visit likely due to this. -Resume Losartan 100mg  daily. -Provide paper prescription for Losartan to be filled at [facility].  Hyperlipidemia Well controlled on Crestor 20mg  daily. LDL 72, Triglycerides 73, HDL 72. -Continue Crestor 20mg  daily. -Provide paper prescription for Crestor to be filled  at [facility].  Coronary Artery Disease Minimal calcification in coronary arteries and minimal CAD noted on coronary CTA. Patient is asymptomatic. -Continue current management with Crestor 20mg  daily.  Follow-up in 6 months to monitor response to resumption of Losartan and ongoing management of hyperlipidemia and coronary artery disease.     Intermittent LBBB - no obstructive Cad, no LBBB today.             Signed, Parke Poisson, MD

## 2023-08-25 IMAGING — CT CT CARDIAC CORONARY ARTERY CALCIUM SCORE
3 series · 12 of 20 positions shown, 14 images · non-contrast
Comparison: Prior calcium score study on 05/22/2020

CLINICAL DATA: 65-year-old Caucasian female with history of
hyperlipidemia and family history of heart disease.

EXAM:
CT CARDIAC CORONARY ARTERY CALCIUM SCORE
TECHNIQUE: Non-contrast imaging through the heart was performed using
prospective ECG gating. Image post processing was performed on an
independent workstation, allowing for quantitative analysis of the
heart and coronary arteries. Note that this exam targets the heart
and the chest was not imaged in its entirety.

[Series 2: calcium scoring 2.00 qr36 bestdiast 72% hrt calciu · axial · 0.32mm/px · z∈[+1658,+1684]mm · 2 of 68 slices shown]
[im 14/68  vessel]
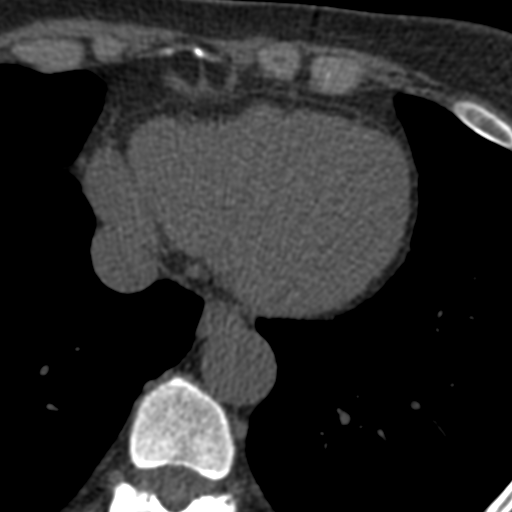
[im 27/68  vessel]
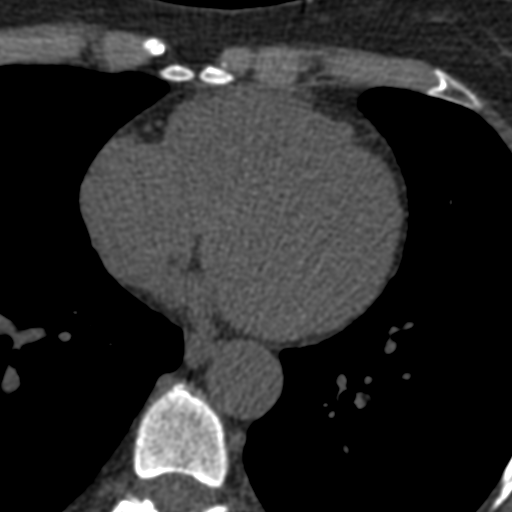

[Series 3: calcium scoring 2.00 br40 bestdiast 72% axial · axial · 0.47mm/px · z∈[+1654,+1742]mm · 5 of 68 slices shown, 7 images]
[im 12/68  vessel]
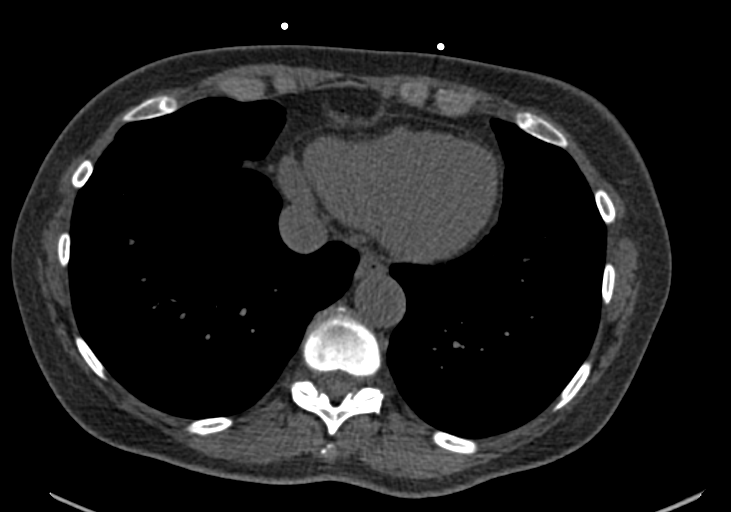
[im 12/68  lung]
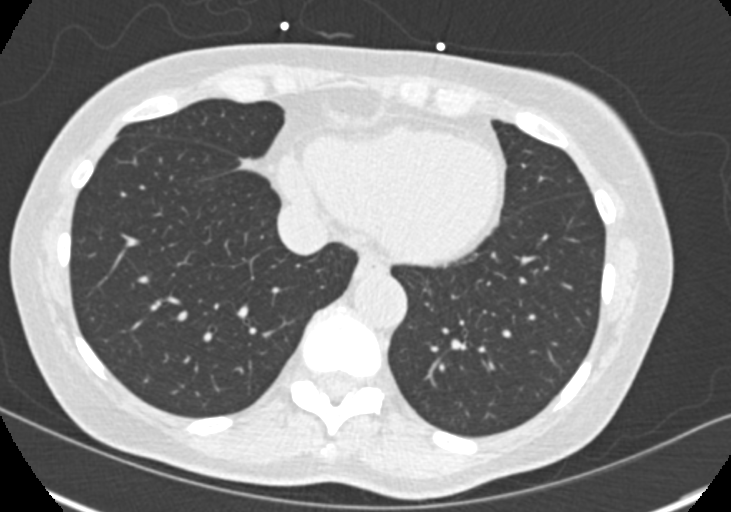
[im 23/68  vessel]
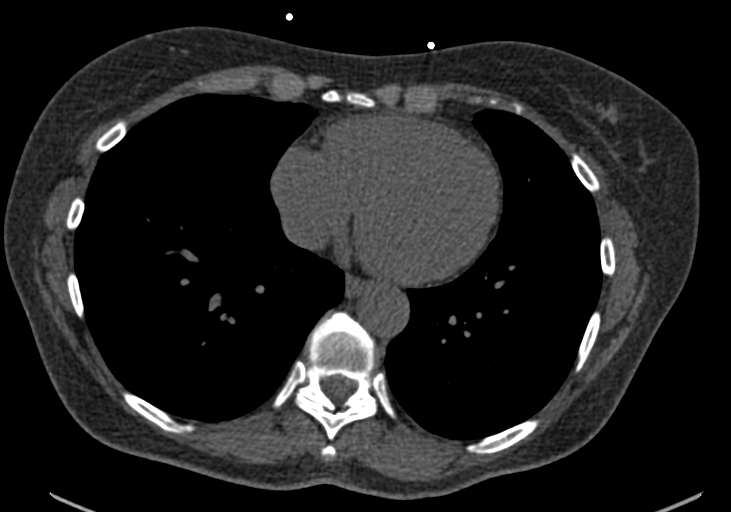
[im 34/68  vessel]
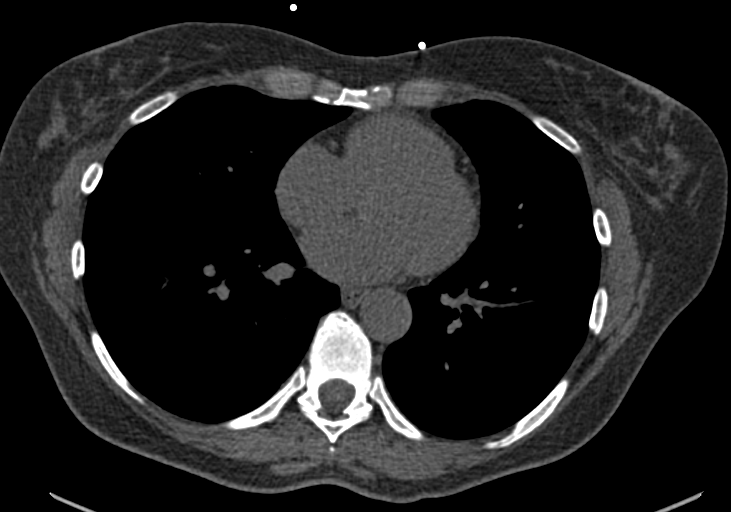
[im 45/68  vessel]
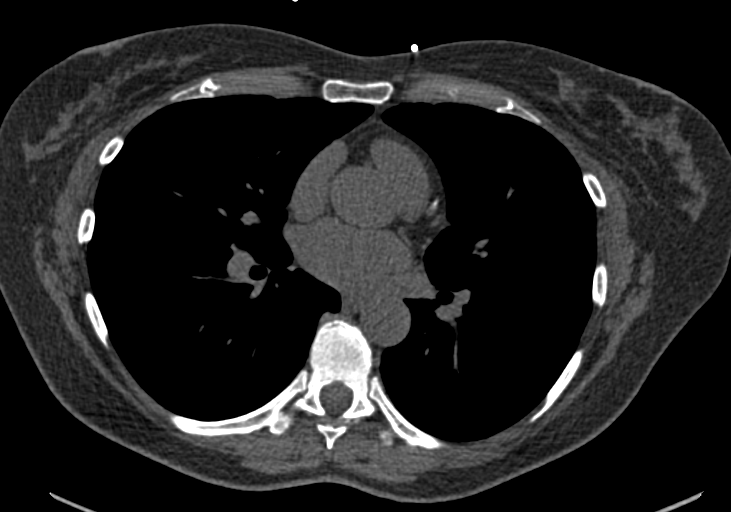
[im 56/68  vessel]
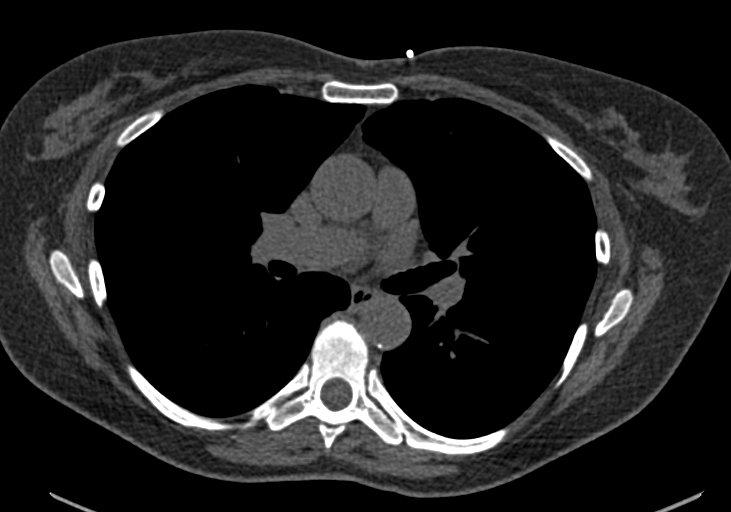
[im 56/68  lung]
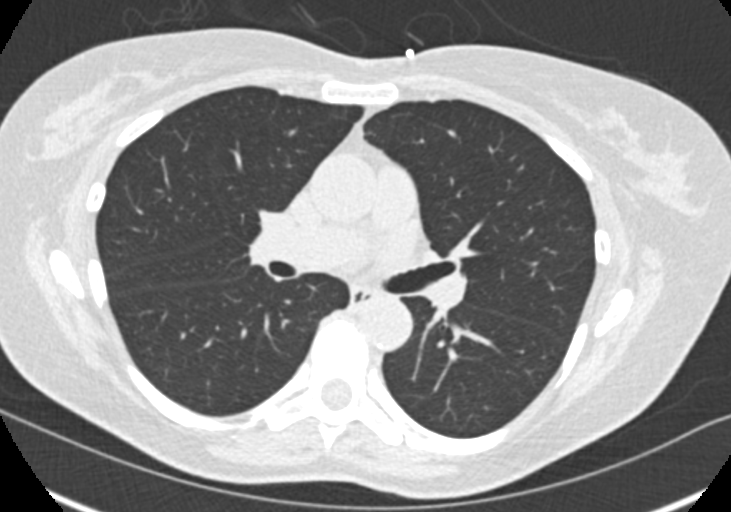

[Series 9: calcium scoring 2.00 br60 bestdiast 72% lungs · axial · 0.47mm/px · z∈[+1654,+1742]mm · 5 of 68 slices shown]
[im 12/68  vessel]
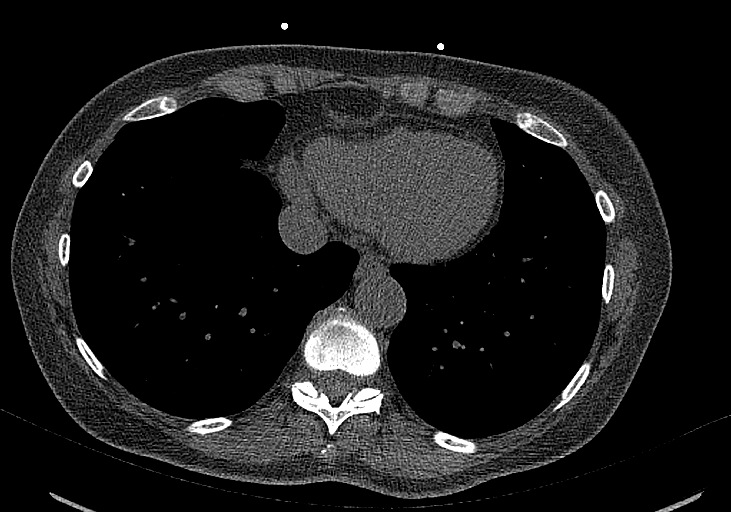
[im 23/68  vessel]
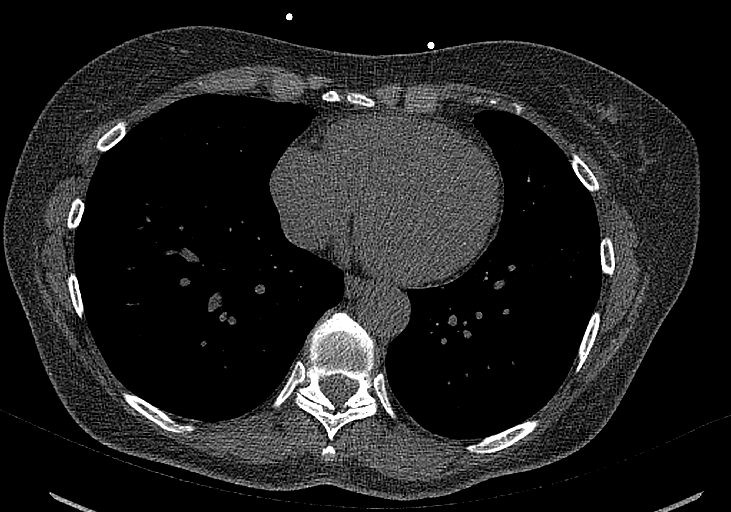
[im 34/68  vessel]
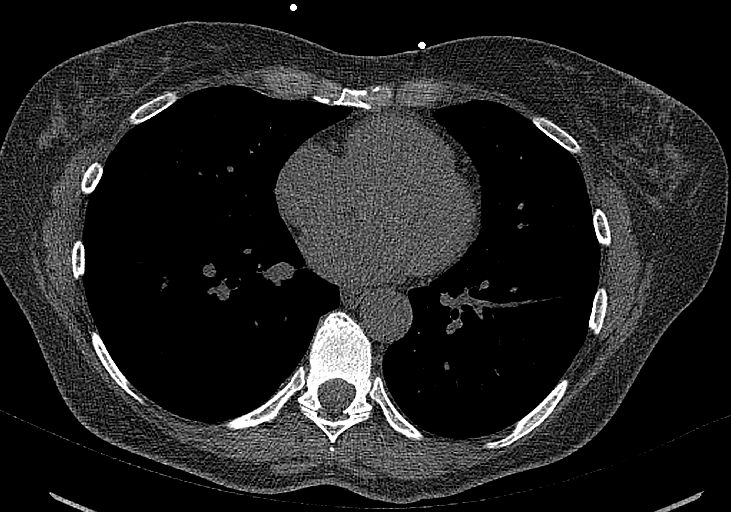
[im 45/68  vessel]
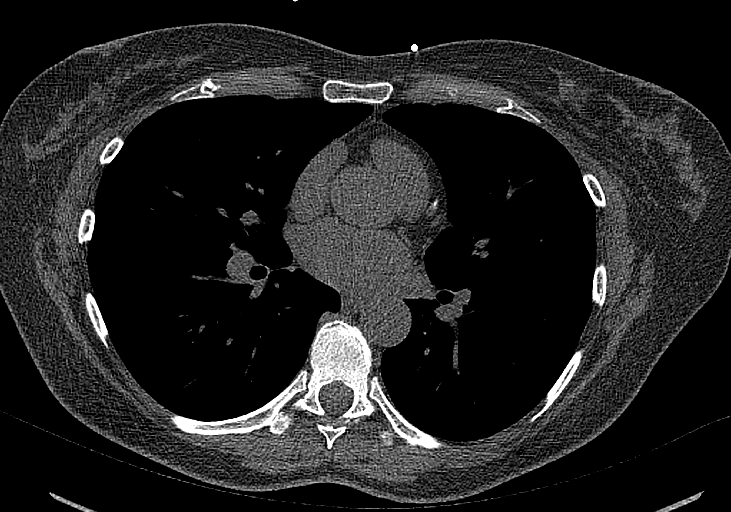
[im 56/68  vessel]
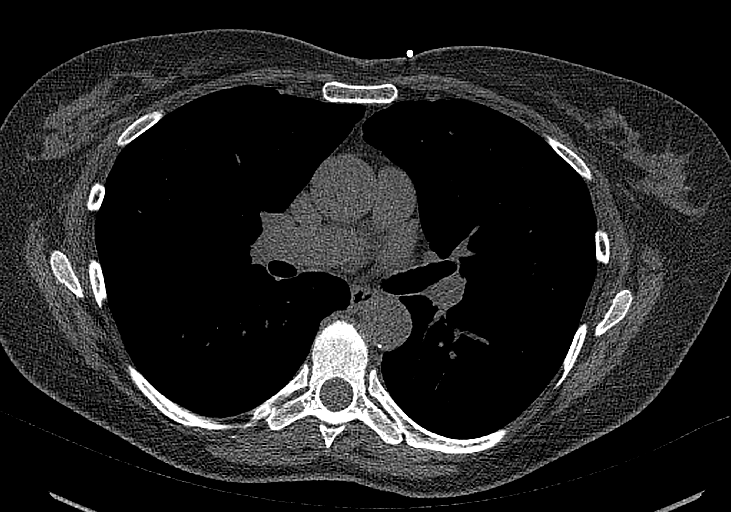

[12 of 20 positions shown; findings below may reference images not displayed]

FINDINGS: CORONARY CALCIUM SCORES:

Left Main: 0

LAD:

LCx: 0

RCA: 0

Total Agatston Score:

[HOSPITAL] percentile: 61

AORTA MEASUREMENTS:

Ascending Aorta: 31 mm

Descending Aorta: 23 mm

OTHER FINDINGS:

The heart size is within normal limits. No pericardial fluid is
identified. Visualized segments of the thoracic aorta and central
pulmonary arteries are normal in caliber. Visualized mediastinum and
hilar regions demonstrate no lymphadenopathy or masses. Visualized
lungs show no evidence of pulmonary edema, consolidation,
pneumothorax, nodule or pleural fluid. Visualized upper abdomen and
bony structures are unremarkable.
IMPRESSION: Coronary calcium score of 16.7 is at the 61st percentile for the
patient's age, sex and race.

## 2023-09-27 ENCOUNTER — Ambulatory Visit: Payer: Medicare Other | Attending: Internal Medicine | Admitting: Internal Medicine

## 2023-09-27 ENCOUNTER — Encounter: Payer: Self-pay | Admitting: Internal Medicine

## 2023-09-27 VITALS — BP 130/60 | HR 52 | Ht 65.0 in | Wt 122.8 lb

## 2023-09-27 DIAGNOSIS — E7800 Pure hypercholesterolemia, unspecified: Secondary | ICD-10-CM

## 2023-09-27 DIAGNOSIS — I447 Left bundle-branch block, unspecified: Secondary | ICD-10-CM

## 2023-09-27 DIAGNOSIS — I10 Essential (primary) hypertension: Secondary | ICD-10-CM

## 2023-09-27 DIAGNOSIS — R4189 Other symptoms and signs involving cognitive functions and awareness: Secondary | ICD-10-CM

## 2023-09-27 DIAGNOSIS — Z79899 Other long term (current) drug therapy: Secondary | ICD-10-CM

## 2023-09-27 DIAGNOSIS — I2510 Atherosclerotic heart disease of native coronary artery without angina pectoris: Secondary | ICD-10-CM

## 2023-09-27 DIAGNOSIS — I700 Atherosclerosis of aorta: Secondary | ICD-10-CM | POA: Diagnosis not present

## 2023-09-27 DIAGNOSIS — R9431 Abnormal electrocardiogram [ECG] [EKG]: Secondary | ICD-10-CM

## 2023-09-27 MED ORDER — EZETIMIBE 10 MG PO TABS: 10.0000 mg | ORAL_TABLET | Freq: Every day | ORAL | 3 refills | Status: AC

## 2023-09-27 NOTE — Patient Instructions (Signed)
 Medication Instructions:  Stop: Rosuvastatin (Crestor)   Start: Ezetimibe (Zetia) 10 mg (one tablet) once daily   Lab Work: None  Testing/Procedures: None  Follow-Up: At [facility], you and your health needs are our priority.  As part of our continuing mission to provide you with exceptional heart care, we have created designated Provider Care Teams.  These Care Teams include your primary Cardiologist (physician) and Advanced Practice Providers (APPs -  Physician Assistants and Nurse Practitioners) who all work together to provide you with the care you need, when you need it.  Your next appointment:   6 month(s)  Provider:   Parke Poisson, MD

## 2023-09-27 NOTE — Progress Notes (Signed)
 Cardiology Office Note:    Date:  09/27/2023   ID:  Mary Maxwell, DOB Jun 18, 1956, MRN [id number]  PCP:  Richarda Overlie, MD  Cardiologist:  Parke Poisson, MD  Electrophysiologist:  None   Referring MD: Richarda Overlie, MD   Chief Complaint/Reason for Referral: Brain fog, HLD, CAD  History of Present Illness:    Mary Maxwell is a 68 y.o. female with a history of hypertension, hyperlipidemia, palpitations, coronary artery disease - nonobstructive by CCTA, atherosclerosis of the aorta, and macular degeneration, presents for a follow-up visit. She reports feeling well overall.   Main concern today is brain fog, and she is concerned that her statin is implicated in this. We discussed relevant literature, and that this is less likely, but patient remains quite concerned and we discussed stopping statin. LDL not yet optimized, and with minimal CAD on CCTA and aortic athero, would recommend alternative therapy. She would like to try alternative oral therapy.  The patient denies chest pain, chest pressure, dyspnea at rest or with exertion, palpitations, PND, orthopnea, or leg swelling. Denies cough, fever, chills. Denies nausea, vomiting. Denies syncope or presyncope. Denies dizziness or lightheadedness. Denies snoring.     Past Medical History:  Diagnosis Date   Atherosclerosis of aorta (HCC) 09/21/2017   Essential hypertension 05/06/2020   Pure hypercholesterolemia 05/06/2020    No past surgical history on file.  Current Medications: Current Meds  Medication Sig   CLIMARA 0.05 MG/24HR patch Take 0.05 mg by mouth daily.   ezetimibe (ZETIA) 10 MG tablet Take 1 tablet (10 mg total) by mouth daily.   losartan (COZAAR) 100 MG tablet Take 1 tablet (100 mg total) by mouth daily.   [DISCONTINUED] rosuvastatin (CRESTOR) 20 MG tablet Take 1 tablet (20 mg total) by mouth daily.     Allergies:   Sulfa antibiotics and Sulfamethoxazole   Social History   Tobacco Use    Smoking status: Never   Smokeless tobacco: Never  Substance Use Topics   Alcohol use: Yes    Alcohol/week: 5.0 - 7.0 standard drinks of alcohol    Types: 5 - 7 Glasses of wine per week   Drug use: No     Family History: The patient's family history includes AAA (abdominal aortic aneurysm) in her mother; Benign prostatic hyperplasia in her brother; Cancer in her paternal grandmother; Dementia in her mother; Heart attack in her father and maternal grandfather; Heart disease in her father; Leukemia in her paternal grandfather.  ROS:   Please see the history of present illness.    All other systems reviewed and are negative.  EKGs/Labs/Other Studies Reviewed:    The following studies were reviewed today:  EKG:   EKG Interpretation Date/Time:  Tuesday September 27 2023 11:57:59 EDT Ventricular Rate:  52 PR Interval:  166 QRS Duration:  70 QT Interval:  446 QTC Calculation: 414 R Axis:   78  Text Interpretation: Sinus bradycardia Low voltage QRS Cannot rule out Anterior infarct , age undetermined When compared with ECG of 07-Apr-2023 15:00, No significant change was found Confirmed by Weston Brass ([id number]) on 09/27/2023 12:19:23 PM     Recent Labs: No results found for requested labs within last 365 days.  Recent Lipid Panel    Component Value Date/Time   CHOL 214 (H) 05/01/2020 1206   TRIG 88 05/01/2020 1206   HDL 61 05/01/2020 1206   CHOLHDL 3.5 05/01/2020 1206   CHOLHDL 3.0 03/26/2015 1302   VLDL 11 03/26/2015 1302   LDLCALC  137 (H) 05/01/2020 1206    Physical Exam:    VS:  BP 130/60   Pulse (!) 52   Ht 5\' 5"  (1.651 m)   Wt 122 lb 12.8 oz (55.7 kg)   LMP  (LMP Unknown)   SpO2 99%   BMI 20.43 kg/m     Wt Readings from Last 5 Encounters:  09/27/23 122 lb 12.8 oz (55.7 kg)  04/07/23 125 lb 12.8 oz (57.1 kg)  09/23/22 131 lb (59.4 kg)  03/18/21 140 lb (63.5 kg)  05/06/20 144 lb 6.4 oz (65.5 kg)    Constitutional: No acute distress Eyes: sclera non-icteric,  normal conjunctiva and lids ENMT: normal dentition, moist mucous membranes Cardiovascular: regular rhythm, normal rate, no murmur. S1 and S2 normal. No jugular venous distention.  Respiratory: clear to auscultation bilaterally GI : normal bowel sounds, soft and nontender. No distention.   MSK: extremities warm, well perfused. No edema.  NEURO: grossly nonfocal exam, moves all extremities. PSYCH: alert and oriented x 3, normal mood and affect.   ASSESSMENT:    1. Essential hypertension   2. LBBB (left bundle branch block)   3. Atherosclerosis of aorta (HCC)   4. Pure hypercholesterolemia   5. Abnormal electrocardiogram   6. Coronary artery disease involving native coronary artery of native heart without angina pectoris    PLAN:    Essential hypertension  - continue losartan 100 mg daily, bp overall well controlled.   LBBB (left bundle branch block) - intermittent, not present today. 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  Atherosclerosis of aorta (HCC) Pure hypercholesterolemia Abnormal electrocardiogram Coronary artery disease involving native coronary artery of native heart without angina pectoris Brain fog Medication managmenet - she would like to hold her statin for now due to concerns of brain fog. Lipids still require management. Recommend starting zetia 10 mg daily if not on other therapy. Will consider CVRR referral for PCSK9I if zetia insufficiently treats lipids to goal of LDL <70.   Weston Brass, MD, [facility]  [facility]   Shared Decision Making/Informed Consent:       Medication Adjustments/Labs and Tests Ordered: Current medicines are reviewed at length with the patient today.  Concerns regarding medicines are outlined above.   Orders Placed This Encounter  Procedures   EKG 12-Lead    Meds ordered this encounter  Medications   ezetimibe (ZETIA) 10 MG tablet    Sig: Take 1 tablet (10 mg total) by mouth daily.     Dispense:  90 tablet    Refill:  3    Patient Instructions  Medication Instructions:  Stop: Rosuvastatin (Crestor)   Start: Ezetimibe (Zetia) 10 mg (one tablet) once daily   Lab Work: None  Testing/Procedures: None  Follow-Up: At [facility], you and your health needs are our priority.  As part of our continuing mission to provide you with exceptional heart care, we have created designated Provider Care Teams.  These Care Teams include your primary Cardiologist (physician) and Advanced Practice Providers (APPs -  Physician Assistants and Nurse Practitioners) who all work together to provide you with the care you need, when you need it.  Your next appointment:   6 month(s)  Provider:   Parke Poisson, MD

## 2024-02-20 ENCOUNTER — Other Ambulatory Visit: Payer: Self-pay | Admitting: *Deleted

## 2024-02-20 MED ORDER — LOSARTAN POTASSIUM 100 MG PO TABS: 100.0000 mg | ORAL_TABLET | Freq: Every day | ORAL | 0 refills | Status: AC
